# Patient Record
Sex: Female | Born: 1980 | Race: White | Hispanic: No | Marital: Single | Smoking: Never smoker
Health system: Southern US, Community
[De-identification: ages and names within clinical notes are randomized; demographics above are authoritative.]

## PROBLEM LIST (undated history)

## (undated) DIAGNOSIS — T7840XA Allergy, unspecified, initial encounter: Secondary | ICD-10-CM

## (undated) HISTORY — DX: Allergy, unspecified, initial encounter: T78.40XA

## (undated) HISTORY — PX: WISDOM TOOTH EXTRACTION: SHX21

---
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
## 2005-08-06 ENCOUNTER — Ambulatory Visit: Payer: Self-pay | Admitting: Family Medicine

## 2005-10-06 ENCOUNTER — Ambulatory Visit: Payer: Self-pay | Admitting: Family Medicine

## 2006-01-19 ENCOUNTER — Ambulatory Visit: Payer: Self-pay | Admitting: Family Medicine

## 2006-08-03 ENCOUNTER — Ambulatory Visit: Payer: Self-pay | Admitting: Family Medicine

## 2006-08-03 ENCOUNTER — Other Ambulatory Visit: Admission: RE | Admit: 2006-08-03 | Discharge: 2006-08-03 | Payer: Self-pay | Admitting: Internal Medicine

## 2006-08-03 ENCOUNTER — Encounter (INDEPENDENT_AMBULATORY_CARE_PROVIDER_SITE_OTHER): Payer: Self-pay | Admitting: Specialist

## 2006-08-03 DIAGNOSIS — R87612 Low grade squamous intraepithelial lesion on cytologic smear of cervix (LGSIL): Secondary | ICD-10-CM | POA: Insufficient documentation

## 2006-08-03 LAB — CONVERTED CEMR LAB: Pap Smear: ABNORMAL

## 2006-08-04 ENCOUNTER — Encounter: Payer: Self-pay | Admitting: Family Medicine

## 2006-08-04 LAB — CONVERTED CEMR LAB
Blood Glucose, Fasting: 90 mg/dL
TSH: 0.88 microintl units/mL
WBC, blood: 6.4 10*3/uL

## 2006-08-10 ENCOUNTER — Ambulatory Visit: Payer: Self-pay | Admitting: Family Medicine

## 2006-10-17 ENCOUNTER — Ambulatory Visit: Payer: Self-pay | Admitting: Family Medicine

## 2007-01-03 ENCOUNTER — Ambulatory Visit: Payer: Self-pay | Admitting: Family Medicine

## 2008-01-18 ENCOUNTER — Encounter (INDEPENDENT_AMBULATORY_CARE_PROVIDER_SITE_OTHER): Payer: Self-pay | Admitting: Internal Medicine

## 2008-01-18 ENCOUNTER — Ambulatory Visit: Payer: Self-pay | Admitting: Family Medicine

## 2008-01-18 ENCOUNTER — Other Ambulatory Visit: Admission: RE | Admit: 2008-01-18 | Discharge: 2008-01-18 | Payer: Self-pay | Admitting: Family Medicine

## 2008-01-18 DIAGNOSIS — F4321 Adjustment disorder with depressed mood: Secondary | ICD-10-CM

## 2008-01-18 DIAGNOSIS — J309 Allergic rhinitis, unspecified: Secondary | ICD-10-CM | POA: Insufficient documentation

## 2008-01-18 DIAGNOSIS — Z87442 Personal history of urinary calculi: Secondary | ICD-10-CM | POA: Insufficient documentation

## 2008-01-22 ENCOUNTER — Encounter: Payer: Self-pay | Admitting: Family Medicine

## 2008-01-24 LAB — CONVERTED CEMR LAB
Basophils Relative: 0.4 % (ref 0.0–1.0)
CO2: 28 meq/L (ref 19–32)
Creatinine, Ser: 0.7 mg/dL (ref 0.4–1.2)
Eosinophils Relative: 4.6 % (ref 0.0–5.0)
GFR calc Af Amer: 132 mL/min
GFR calc non Af Amer: 109 mL/min
Glucose, Bld: 88 mg/dL (ref 70–99)
HCT: 38.3 % (ref 36.0–46.0)
Hemoglobin: 13.5 g/dL (ref 12.0–15.0)
Lymphocytes Relative: 28.2 % (ref 12.0–46.0)
Monocytes Absolute: 0.3 10*3/uL (ref 0.2–0.7)
Monocytes Relative: 4.8 % (ref 3.0–11.0)
Neutro Abs: 4.1 10*3/uL (ref 1.4–7.7)
Neutrophils Relative %: 62 % (ref 43.0–77.0)
Potassium: 3.8 meq/L (ref 3.5–5.1)
Sodium: 139 meq/L (ref 135–145)
WBC: 6.6 10*3/uL (ref 4.5–10.5)

## 2008-01-25 ENCOUNTER — Encounter (INDEPENDENT_AMBULATORY_CARE_PROVIDER_SITE_OTHER): Payer: Self-pay | Admitting: Internal Medicine

## 2008-01-26 ENCOUNTER — Encounter (INDEPENDENT_AMBULATORY_CARE_PROVIDER_SITE_OTHER): Payer: Self-pay | Admitting: *Deleted

## 2008-02-02 ENCOUNTER — Ambulatory Visit: Payer: Self-pay | Admitting: Family Medicine

## 2008-02-02 DIAGNOSIS — F341 Dysthymic disorder: Secondary | ICD-10-CM | POA: Insufficient documentation

## 2008-02-27 ENCOUNTER — Ambulatory Visit: Payer: Self-pay | Admitting: Family Medicine

## 2008-04-02 ENCOUNTER — Telehealth (INDEPENDENT_AMBULATORY_CARE_PROVIDER_SITE_OTHER): Payer: Self-pay | Admitting: Internal Medicine

## 2008-04-08 ENCOUNTER — Telehealth (INDEPENDENT_AMBULATORY_CARE_PROVIDER_SITE_OTHER): Payer: Self-pay | Admitting: Internal Medicine

## 2008-06-30 ENCOUNTER — Emergency Department (HOSPITAL_COMMUNITY): Admission: EM | Admit: 2008-06-30 | Discharge: 2008-06-30 | Payer: Self-pay | Admitting: Emergency Medicine

## 2008-06-30 ENCOUNTER — Ambulatory Visit: Payer: Self-pay | Admitting: *Deleted

## 2008-06-30 ENCOUNTER — Inpatient Hospital Stay (HOSPITAL_COMMUNITY): Admission: AD | Admit: 2008-06-30 | Discharge: 2008-07-01 | Payer: Self-pay | Admitting: *Deleted

## 2008-09-06 ENCOUNTER — Ambulatory Visit: Payer: Self-pay | Admitting: Family Medicine

## 2008-09-06 ENCOUNTER — Encounter (INDEPENDENT_AMBULATORY_CARE_PROVIDER_SITE_OTHER): Payer: Self-pay | Admitting: Internal Medicine

## 2008-09-12 ENCOUNTER — Encounter (INDEPENDENT_AMBULATORY_CARE_PROVIDER_SITE_OTHER): Payer: Self-pay | Admitting: Internal Medicine

## 2008-09-16 LAB — CONVERTED CEMR LAB: GC Probe Amp, Genital: NEGATIVE

## 2008-11-05 ENCOUNTER — Ambulatory Visit: Payer: Self-pay | Admitting: Family Medicine

## 2008-11-05 DIAGNOSIS — J019 Acute sinusitis, unspecified: Secondary | ICD-10-CM

## 2009-11-26 ENCOUNTER — Other Ambulatory Visit: Admission: RE | Admit: 2009-11-26 | Discharge: 2009-11-26 | Payer: Self-pay | Admitting: Family Medicine

## 2009-11-26 ENCOUNTER — Ambulatory Visit: Payer: Self-pay | Admitting: Family Medicine

## 2009-11-26 DIAGNOSIS — R1310 Dysphagia, unspecified: Secondary | ICD-10-CM | POA: Insufficient documentation

## 2009-11-26 LAB — HM PAP SMEAR

## 2009-11-27 ENCOUNTER — Encounter (INDEPENDENT_AMBULATORY_CARE_PROVIDER_SITE_OTHER): Payer: Self-pay | Admitting: *Deleted

## 2009-12-02 ENCOUNTER — Encounter (INDEPENDENT_AMBULATORY_CARE_PROVIDER_SITE_OTHER): Payer: Self-pay | Admitting: *Deleted

## 2009-12-05 ENCOUNTER — Encounter (INDEPENDENT_AMBULATORY_CARE_PROVIDER_SITE_OTHER): Payer: Self-pay | Admitting: *Deleted

## 2009-12-30 ENCOUNTER — Ambulatory Visit: Payer: Self-pay | Admitting: Gastroenterology

## 2010-01-12 ENCOUNTER — Ambulatory Visit: Payer: Self-pay | Admitting: Family Medicine

## 2010-04-01 ENCOUNTER — Ambulatory Visit: Payer: Self-pay | Admitting: Family Medicine

## 2010-04-03 ENCOUNTER — Ambulatory Visit: Payer: Self-pay | Admitting: Family Medicine

## 2010-04-03 DIAGNOSIS — F988 Other specified behavioral and emotional disorders with onset usually occurring in childhood and adolescence: Secondary | ICD-10-CM | POA: Insufficient documentation

## 2010-04-03 DIAGNOSIS — IMO0002 Reserved for concepts with insufficient information to code with codable children: Secondary | ICD-10-CM | POA: Insufficient documentation

## 2010-05-11 ENCOUNTER — Telehealth: Payer: Self-pay | Admitting: Family Medicine

## 2010-06-15 ENCOUNTER — Telehealth: Payer: Self-pay | Admitting: Family Medicine

## 2010-07-13 ENCOUNTER — Telehealth: Payer: Self-pay | Admitting: Family Medicine

## 2010-08-12 ENCOUNTER — Telehealth: Payer: Self-pay | Admitting: Family Medicine

## 2010-09-14 ENCOUNTER — Telehealth: Payer: Self-pay | Admitting: Family Medicine

## 2010-10-14 ENCOUNTER — Telehealth: Payer: Self-pay | Admitting: Family Medicine

## 2010-11-24 ENCOUNTER — Telehealth (INDEPENDENT_AMBULATORY_CARE_PROVIDER_SITE_OTHER): Payer: Self-pay | Admitting: *Deleted

## 2010-11-24 ENCOUNTER — Telehealth: Payer: Self-pay | Admitting: Family Medicine

## 2011-01-12 ENCOUNTER — Telehealth: Payer: Self-pay | Admitting: Family Medicine

## 2011-01-26 NOTE — Assessment & Plan Note (Signed)
Summary: fever,body aches,coughing,   Vital Signs:  Patient profile:   30 year old female Height:      62.25 inches Weight:      153 pounds BMI:     27.86 Temp:     98.4 degrees F oral Pulse rate:   80 / minute Pulse rhythm:   regular BP sitting:   108 / 68  (left arm) Cuff size:   regular  Vitals Entered By: Delilah Shan CMA Duncan Dull) (January 12, 2010 2:00 PM) CC: Fever, body aches, coughing,    History of Present Illness: 30 yo with 1 1/2 days of high fever, Tmax 102.5, chills, sweats, and body aches. Has had dry cough as well. No sore throat, no ear pain, no n/v/d. No rashes.  No wheezing, no SOB. Taking OTC Alleve and Dayquil with only mild relief of symptoms.  Boyfriend was told he had the flu at urgent care.  Current Medications (verified): 1)  Multivitamins   Tabs (Multiple Vitamin) .... Take 1 Tablet By Mouth Once A Day 2)  Tamiflu 75 Mg Caps (Oseltamivir Phosphate) .... Take One Capsule By Mouth Twice A Day X 5 Days 3)  Cheratussin Ac 100-10 Mg/15ml Syrp (Guaifenesin-Codeine) .... 5 Ml At Bedtime For Cough.  Allergies: 1)  ! Amoxicillin  Review of Systems      See HPI General:  Complains of chills and fever. ENT:  Complains of nasal congestion; denies sinus pressure and sore throat. Resp:  Complains of cough; denies shortness of breath, sputum productive, and wheezing.  Physical Exam  General:  alert, well-developed, well-nourished, and well-hydrated.  Appears like she does not feel well but non toxic. Ears:  External ear exam shows no significant lesions or deformities.  Otoscopic examination reveals clear canals, tympanic membranes are intact bilaterally without bulging, retraction, inflammation or discharge. Hearing is grossly normal bilaterally. Nose:  external erythema.   Mouth:  Oral mucosa and oropharynx without lesions or exudates.  Teeth in good repair. MMM Lungs:  Normal respiratory effort, chest expands symmetrically. Lungs are clear to auscultation,  no crackles or wheezes. Heart:  Normal rate and regular rhythm. S1 and S2 normal without gallop, murmur, click, rub or other extra sounds. Psych:  normally interactive and good eye contact.     Impression & Recommendations:  Problem # 1:  INFLUENZA LIKE ILLNESS (ICD-487.1) Assessment New Given h/o sick contacts and that she is still in window for treatment with Tamiflu, will treat for influenza. Chertussin for cough.  Follow up if no improvement in 5-7 days.  Complete Medication List: 1)  Multivitamins Tabs (Multiple vitamin) .... Take 1 tablet by mouth once a day 2)  Tamiflu 75 Mg Caps (Oseltamivir phosphate) .... Take one capsule by mouth twice a day x 5 days 3)  Cheratussin Ac 100-10 Mg/67ml Syrp (Guaifenesin-codeine) .... 5 ml at bedtime for cough. Prescriptions: CHERATUSSIN AC 100-10 MG/5ML SYRP (GUAIFENESIN-CODEINE) 5 ml at bedtime for cough.  #4 ounces x 0   Entered and Authorized by:   Ruthe Mannan MD   Signed by:   Ruthe Mannan MD on 01/12/2010   Method used:   Print then Give to Patient   RxID:   [id number] TAMIFLU 75 MG CAPS (OSELTAMIVIR PHOSPHATE) Take one capsule by mouth twice a day x 5 days  #10 x 0   Entered and Authorized by:   Ruthe Mannan MD   Signed by:   Ruthe Mannan MD on 01/12/2010   Method used:   Print then Give to Patient  RxID:   [id number]   Current Allergies (reviewed today): ! AMOXICILLIN

## 2011-01-26 NOTE — Progress Notes (Signed)
Summary: regarding meds  Phone Note Call from Patient Call back at [facility] Phone [phone]   Caller: Patient Summary of Call: Pt says she checked with her pharmacy and they have vyvanse and concerta in stock.  They also have adderall 10 mg's. Initial call taken by: Lowella Petties CMA, AAMA,  November 24, 2010 9:23 AM  Follow-up for Phone Call        why can't we just do adderall 10 mg tablets cut in half? I will print out new rx. Ruthe Mannan MD  November 24, 2010 9:45 AM  Patient advised via message left on cell phone voicemail.  Rx left at front desk for pick up. Follow-up by: Linde Gillis CMA Duncan Dull),  November 24, 2010 10:13 AM    New/Updated Medications: ADDERALL 10 MG TABS (AMPHETAMINE-DEXTROAMPHETAMINE) 1/2 tab by mouth every morning. Prescriptions: ADDERALL 10 MG TABS (AMPHETAMINE-DEXTROAMPHETAMINE) 1/2 tab by mouth every morning.  #30 x 0   Entered and Authorized by:   Ruthe Mannan MD   Signed by:   Ruthe Mannan MD on 11/24/2010   Method used:   Print then Give to Patient   RxID:   [id number]

## 2011-01-26 NOTE — Progress Notes (Signed)
Summary: refill request for adderall  Phone Note Refill Request Call back at Home Phone [phone] Message from:  Patient  Refills Requested: Medication #1:  ADDERALL 5 MG  TABS 1 tab by mouth daily.. Please call pt when ready.  Initial call taken by: Lowella Petties CMA,  October 14, 2010 10:02 AM  Follow-up for Phone Call        Message left on cell phone voicemail advising patient Rx ready for pick up, will be left at front desk. Follow-up by: Linde Gillis CMA Duncan Dull),  October 14, 2010 10:33 AM    Prescriptions: ADDERALL 5 MG  TABS (AMPHETAMINE-DEXTROAMPHETAMINE) 1 tab by mouth daily.  #30 x 0   Entered and Authorized by:   Ruthe Mannan MD   Signed by:   Ruthe Mannan MD on 10/14/2010   Method used:   Print then Give to Patient   RxID:   [id number]

## 2011-01-26 NOTE — Progress Notes (Signed)
Summary: adderall   Phone Note Refill Request Call back at Home Phone [phone] Message from:  Patient on July 13, 2010 10:39 AM  Refills Requested: Medication #1:  ADDERALL 5 MG  TABS 1 tab by mouth daily..  Method Requested: Pick up at Office Initial call taken by: Melody Comas,  July 13, 2010 10:39 AM Caller: Patient Call For: Ruthe Mannan MD  Follow-up for Phone Call        in my box for pick up. Ruthe Mannan MD  July 13, 2010 10:41 AM   Additional Follow-up for Phone Call Additional follow up Details #1::        Patient notified by message.  Additional Follow-up by: Melody Comas,  July 13, 2010 11:54 AM    Prescriptions: ADDERALL 5 MG  TABS (AMPHETAMINE-DEXTROAMPHETAMINE) 1 tab by mouth daily.  #30 x 0   Entered and Authorized by:   Ruthe Mannan MD   Signed by:   Ruthe Mannan MD on 07/13/2010   Method used:   Print then Give to Patient   RxID:   [id number]

## 2011-01-26 NOTE — Progress Notes (Signed)
Summary: Adderall  Phone Note Refill Request Message from:  Patient [phone] on June 15, 2010 9:01 AM  Refills Requested: Medication #1:  ADDERALL 5 MG  TABS 1 tab by mouth daily.. Please call patient when prescription is ready for pickup. [phone]   Method Requested: Pick up at Office Initial call taken by: Delilah Shan CMA (AAMA),  June 15, 2010 9:01 AM    Prescriptions: ADDERALL 5 MG  TABS (AMPHETAMINE-DEXTROAMPHETAMINE) 1 tab by mouth daily.  #30 x 0   Entered and Authorized by:   Ruthe Mannan MD   Signed by:   Ruthe Mannan MD on 06/15/2010   Method used:   Print then Give to Patient   RxID:   [id number]   Appended Document: Adderall Message left on cell phone voicemail, Rx ready for pick up will be left at front desk.

## 2011-01-26 NOTE — Progress Notes (Signed)
Summary: refill request for adderall  Phone Note Refill Request Call back at Home Phone [phone] Message from:  Patient  Refills Requested: Medication #1:  ADDERALL 5 MG  TABS 1 tab by mouth daily.. Please call when ready   Initial call taken by: Lowella Petties CMA,  September 14, 2010 3:10 PM  Follow-up for Phone Call        Patient notified as instructed via telephone, Rx ready for pick up will be left at front desk. Follow-up by: Linde Gillis CMA Duncan Dull),  September 14, 2010 3:24 PM    Prescriptions: ADDERALL 5 MG  TABS (AMPHETAMINE-DEXTROAMPHETAMINE) 1 tab by mouth daily.  #30 x 0   Entered and Authorized by:   Ruthe Mannan MD   Signed by:   Ruthe Mannan MD on 09/14/2010   Method used:   Print then Give to Patient   RxID:   [id number]

## 2011-01-26 NOTE — Assessment & Plan Note (Signed)
Summary: back pain/ alc   Vital Signs:  Patient profile:   30 year old female Height:      62.25 inches Weight:      151.13 pounds BMI:     27.52 Temp:     97.5 degrees F oral Pulse rate:   84 / minute Pulse rhythm:   regular BP sitting:   104 / 64  (left arm) Cuff size:   regular  Vitals Entered By: Delilah Shan CMA Duncan Dull) (April 03, 2010 8:05 AM) CC: Back pain   2.  ? ADD issue   History of Present Illness: 30 yo here for back pain and ADD.  1.  Back pain- has been doing new intensive fitness routine, weight lifting and cardio 5 days per week.  Noticed when she gets home that her left thoracic area is sore, especially when taking deep breaths, laughing, coughing.  Saw a chiropracter who told her she pulled a muscle between her rib cage. Strectching better doing cool down, which has helped. No shorntess of breath radiculopathy or muscluar weakness.  2.  ADD- was told that she had ADHD or ADD in college but always did well.  Took her a little longe to finish projects but she did well.  Now she is working two jobs and going to school at night.  She stays up until 3 am every night trying to finish her work because she cannot concentrate.  Has big exams coming up then she will be done, interested in taking something to help with concentration until she finishes. Does not have problems sitting still.  Can sit through a movie without getting up but does often loose focus.  Current Medications (verified): 1)  Multivitamins   Tabs (Multiple Vitamin) .... Take 1 Tablet By Mouth Once A Day 2)  Cyclobenzaprine Hcl 10 Mg  Tabs (Cyclobenzaprine Hcl) .Marland Kitchen.. 1 By Mouth 2 Times Daily As Needed For Back Pain 3)  Adderall 5 Mg  Tabs (Amphetamine-Dextroamphetamine) .Marland Kitchen.. 1 Tab By Mouth Daily.  Allergies: 1)  ! Amoxicillin  Review of Systems      See HPI General:  Denies malaise. CV:  Denies chest pain or discomfort. Resp:  Denies shortness of breath. Psych:  Denies anxiety and  depression.  Physical Exam  General:  alert, well-developed, well-nourished, and well-hydrated.   Normotensive. Eyes:  No corneal or conjunctival inflammation noted. EOMI. Perrla. Funduscopic exam benign, without hemorrhages, exudates or papilledema. Vision grossly normal. Mouth:  MMM Lungs:  Normal respiratory effort, chest expands symmetrically. Lungs are clear to auscultation, no crackles or wheezes. Heart:  Normal rate and regular rhythm. S1 and S2 normal without gallop, murmur, click, rub or other extra sounds.   Detailed Back/Spine Exam  Thoracic Exam:  Inspection-deformity:    Normal Palpation-spinal tenderness:  Normal Sensory Exam/Pinprick:    Right:       T1:       normal       T2:       normal       T3:       normal       T4:       normal       T5:       normal       T6:       normal       T7:       normal       T8:       normal  Left:       T1:       normal       T2:       normal       T3:       normal       T4:       normal       T5:       normal       T6:       normal       T7:       normal       T8:       normal   Impression & Recommendations:  Problem # 1:  SPRAIN AND STRAIN OF UNSPECIFIED SITE OF BACK (ICD-847.9) Assessment New No red flag symptoms. Will go ahead and give Flexeril as needed. Discussed stretches as well.  Problem # 2:  ADD (ICD-314.00) Assessment: New Time spent with patient 25 minutes, more than 50% of this time was spent counseling patient on ADD/ADHD. Since she did have symptoms earlier in life, likely that she does have some form of ADD.  No apparent symptoms of hyperactivity. Will give Adderall, low dose for short period of time until she finishes her exam.  Advised taking in morning and NOT on weekend.s  Complete Medication List: 1)  Multivitamins Tabs (Multiple vitamin) .... Take 1 tablet by mouth once a day 2)  Cyclobenzaprine Hcl 10 Mg Tabs (Cyclobenzaprine hcl) .Marland Kitchen.. 1 by mouth 2 times daily as needed for back pain 3)   Adderall 5 Mg Tabs (Amphetamine-dextroamphetamine) .Marland Kitchen.. 1 tab by mouth daily. Prescriptions: ADDERALL 5 MG  TABS (AMPHETAMINE-DEXTROAMPHETAMINE) 1 tab by mouth daily.  #30 x 0   Entered and Authorized by:   Ruthe Mannan MD   Signed by:   Ruthe Mannan MD on 04/03/2010   Method used:   Print then Give to Patient   RxID:   [id number] CYCLOBENZAPRINE HCL 10 MG  TABS (CYCLOBENZAPRINE HCL) 1 by mouth 2 times daily as needed for back pain  #20 x 0   Entered and Authorized by:   Ruthe Mannan MD   Signed by:   Ruthe Mannan MD on 04/03/2010   Method used:   [facility] to        [id number]       [address]       [place], [place]  [address]       Ph: [phone]       Fax: [phone]   RxID:   [id number]   Current Allergies (reviewed today): ! AMOXICILLIN

## 2011-01-26 NOTE — Progress Notes (Signed)
Summary: regarding adderall  Phone Note Call from Patient Call back at [facility] Phone [phone]   Caller: Patient Summary of Call: Pt needs a refill on adderall but she says this is very hard to find at pharmacies and she is asking if she can change to something else until adderall becomes more available.  [facility]. Initial call taken by: Lowella Petties CMA, AAMA,  November 24, 2010 8:20 AM  Follow-up for Phone Call        please call her pharmacy to see what they have in stock- i.e. ritalin, etc. Ruthe Mannan MD  November 24, 2010 8:21 AM Advised pt, she will call back with information.   Lowella Petties CMA, AAMA  November 24, 2010 9:11 AM

## 2011-01-26 NOTE — Progress Notes (Signed)
Summary: adderall  Phone Note Refill Request Call back at Home Phone [phone] Message from:  Patient on August 12, 2010 9:25 AM  Refills Requested: Medication #1:  ADDERALL 5 MG  TABS 1 tab by mouth daily..  Method Requested: Pick up at Office Initial call taken by: Melody Comas,  August 12, 2010 9:25 AM    Prescriptions: ADDERALL 5 MG  TABS (AMPHETAMINE-DEXTROAMPHETAMINE) 1 tab by mouth daily.  #30 x 0   Entered and Authorized by:   Ruthe Mannan MD   Signed by:   Ruthe Mannan MD on 08/12/2010   Method used:   Print then Give to Patient   RxID:   [id number]   Appended Document: adderall Patient advised via message left on cell phone voicemail.  Rx ready for pick up, will be left at front desk.

## 2011-01-26 NOTE — Progress Notes (Signed)
Summary: REFILL ON ADDERALL  Phone Note Call from Patient   Caller: Patient Call For: [phone] Reason for Call: Acute Illness Summary of Call: Pt called, need refill for Adderall refill.Daine Gip  May 11, 2010 3:52 PM  Initial call taken by: Daine Gip,  May 11, 2010 3:52 PM  Follow-up for Phone Call        In my box. Ruthe Mannan MD  May 11, 2010 3:56 PM     Prescriptions: ADDERALL 5 MG  TABS (AMPHETAMINE-DEXTROAMPHETAMINE) 1 tab by mouth daily.  #30 x 0   Entered and Authorized by:   Ruthe Mannan MD   Signed by:   Ruthe Mannan MD on 05/11/2010   Method used:   Print then Give to Patient   RxID:   [id number]   Appended Document: REFILL ON ADDERALL Left message on cell phone voicemail that Rx is ready for pick up, will be left at front desk.

## 2011-01-28 NOTE — Progress Notes (Signed)
Summary: refill request for adderall  Phone Note Refill Request Message from:  Patient  Refills Requested: Medication #1:  ADDERALL 10 MG TABS 1/2 tab by mouth every morning.. Please call pt when ready.  Initial call taken by: Lowella Petties CMA, AAMA,  January 12, 2011 12:19 PM  Follow-up for Phone Call        Message left on cell phone voicemail advising patient that Rx is ready for pick up will be left at front desk. Follow-up by: Linde Gillis CMA Duncan Dull),  January 13, 2011 8:09 AM    Prescriptions: ADDERALL 10 MG TABS (AMPHETAMINE-DEXTROAMPHETAMINE) 1/2 tab by mouth every morning.  #30 x 0   Entered and Authorized by:   Ruthe Mannan MD   Signed by:   Ruthe Mannan MD on 01/13/2011   Method used:   Print then Give to Patient   RxID:   [id number]

## 2011-03-17 ENCOUNTER — Telehealth: Payer: Self-pay | Admitting: *Deleted

## 2011-03-17 NOTE — Telephone Encounter (Signed)
Pt requests refill on adderal, please call when ready.

## 2011-03-18 ENCOUNTER — Other Ambulatory Visit: Payer: Self-pay | Admitting: *Deleted

## 2011-03-18 ENCOUNTER — Other Ambulatory Visit: Payer: Self-pay | Admitting: Obstetrics and Gynecology

## 2011-03-18 MED ORDER — AMPHETAMINE-DEXTROAMPHETAMINE 10 MG PO TABS: ORAL_TABLET | ORAL | Status: DC

## 2011-03-18 NOTE — Telephone Encounter (Signed)
Patient advised Rx ready for pick up will be left at front desk. 

## 2011-03-18 NOTE — Telephone Encounter (Signed)
Please enter proper rx request with dosage, etc. Thanks.

## 2011-05-14 NOTE — Discharge Summary (Signed)
Sabrina Robertson, Sabrina Robertson              ACCOUNT NO.:  [id number]   MEDICAL RECORD NO.:  [id number]          PATIENT TYPE:  IPS   LOCATION:  0604                          FACILITY:  [facility]   PHYSICIAN:  Geoffery Lyons, M.D.      DATE OF BIRTH:  02/05/1981   DATE OF ADMISSION:  06/30/2008  DATE OF DISCHARGE:  07/01/2008                               DISCHARGE SUMMARY   CHIEF COMPLAINT AND PRESENT ILLNESS:  This was the first admission to  [facility] for this 30 year old single female who was  admitted after she overdosed, taking some of the boyfriend's Klonopin  after an argument with him.  Says that they were in [place] [place].  They  partied a lot.  Came back to the area.  She was at a cookout with  friends.  She was drinking with the boyfriend.  They got into an  argument, says the boyfriend was jealous of another friend.  The  argument escalated.  They talked about breaking up.  She left, went to  her home.  She was upset, started thinking about the grandmother who  died a few months prior to this admission.  She is having a hard time  with the grandmother's death.  Took the Klonopin, claims for attention.  Endorsed that the Klonopin hit her faster.  She could not take many.  911 was called.  She was taken to the emergency room and she woke up in  the emergency room.   PAST PSYCHIATRIC HISTORY:  Saw primary care Katisha Shimizu who gave her  Prestiq.  Denies any previous treatment.   ALCOHOL AND DRUG HISTORY:  Denies denies regular use of any substance.  Endorsed she was partying that day, took 4-5 beers, 1 margarita.  Endorsed she was given some marijuana by her boyfriend and she was given  some Adderall so she could continue to party, but endorsed no ongoing  use.   MEDICAL HISTORY:  Noncontributory.   MEDICATIONS:  Prestiq 50 mg per day.   PHYSICAL EXAMINATION:  Failed to show any acute findings.   MENTAL STATUS EXAMINATION:  Reveals an alert cooperative female,  spontaneous,  engaged, regretful for what she did.  Insightful.  Wanted  to be discharged.  Denies suicidal ideas, homicidal ideas and that she  was willing to see a counselor for herself, as well as a couple.  Endorsed that she realized that there were some grief issues having to  do with the death of her grandmother that she needed to deal, but felt  that she did not have to stay in the hospital to do that.  Family was  contacted.  There were no concerns about her safety.  We went and  discharged her to outpatient followup.   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  AXIS II:  No diagnosis.  AXIS III:  No diagnosis.  AXIS IV:  Moderate.  AXIS V:  Upon discharge, 60.   DISCHARGE MEDICATIONS:  Prestiq 50 mg per day.   FOLLOWUP:  Doristine Church,  counselor, and Donnie Aho for medication,  [employer].      Geoffery Lyons, M.D.  Electronically Signed     IL/MEDQ  D:  07/31/2008  T:  07/31/2008  Job:  [id number]

## 2011-05-17 ENCOUNTER — Other Ambulatory Visit: Payer: Self-pay | Admitting: *Deleted

## 2011-05-17 MED ORDER — AMPHETAMINE-DEXTROAMPHETAMINE 10 MG PO TABS: ORAL_TABLET | ORAL | Status: DC

## 2011-05-17 NOTE — Telephone Encounter (Signed)
Sabrina Robertson, has this been taken care of? 

## 2011-05-18 NOTE — Telephone Encounter (Signed)
Patient advised Rx ready for pick up will be left at front desk. 

## 2011-06-13 ENCOUNTER — Ambulatory Visit: Payer: Self-pay | Admitting: Family Medicine

## 2011-06-15 ENCOUNTER — Telehealth: Payer: Self-pay | Admitting: *Deleted

## 2011-06-15 NOTE — Telephone Encounter (Signed)
Patient broke her foot on Saturday and went to urgent care on Sunday. They referred her to [facility] and she says that they prescribed hydrocodone for her. She says that it is making her feel sick on her stomach.  She is asking if she can get something different called in. I told her to call [facility] and she says that she already has and has left several messages and no one has call her back and that she really needs something to help with the pain. [facility].

## 2011-06-16 MED ORDER — TRAMADOL HCL 50 MG PO TABS: 50.0000 mg | ORAL_TABLET | Freq: Four times a day (QID) | ORAL | Status: DC | PRN

## 2011-06-16 NOTE — Telephone Encounter (Signed)
Left message on cell phone voicemail that Rx for Tramadol was sent [facility].

## 2011-06-16 NOTE — Telephone Encounter (Signed)
Tramadol sent in 

## 2011-07-19 ENCOUNTER — Other Ambulatory Visit: Payer: Self-pay | Admitting: *Deleted

## 2011-07-19 MED ORDER — AMPHETAMINE-DEXTROAMPHETAMINE 10 MG PO TABS: ORAL_TABLET | ORAL | Status: DC

## 2011-07-19 NOTE — Telephone Encounter (Signed)
Patient advised as instructed via telephone that Rx is ready for pick up will be left at front desk.

## 2011-07-21 ENCOUNTER — Other Ambulatory Visit: Payer: Self-pay | Admitting: *Deleted

## 2011-07-21 NOTE — Telephone Encounter (Signed)
Ok to print out rx for 15. I will sign tomorrow morning.

## 2011-07-21 NOTE — Telephone Encounter (Signed)
Pt states that her insurance has changed and she needs her adderall rewritten for #15, since she only takes one half a day.  If possible she would like a 2nd script for 15 for next month.  Please call when ready, [facility] but says she can get this back if necessary.

## 2011-07-22 MED ORDER — AMPHETAMINE-DEXTROAMPHETAMINE 10 MG PO TABS: ORAL_TABLET | ORAL | Status: DC

## 2011-07-22 NOTE — Telephone Encounter (Signed)
Rx's left at front desk for patient pick up.  Message left on cell phone voicemail advising patient as instructed.

## 2011-09-23 LAB — DIFFERENTIAL
Eosinophils Absolute: 0.3
Eosinophils Relative: 4
Lymphocytes Relative: 33
Lymphs Abs: 2
Monocytes Absolute: 0.4
Monocytes Relative: 6

## 2011-09-23 LAB — URINALYSIS, ROUTINE W REFLEX MICROSCOPIC
Nitrite: NEGATIVE
Specific Gravity, Urine: 1.015
Urobilinogen, UA: 0.2
pH: 5.5

## 2011-09-23 LAB — COMPREHENSIVE METABOLIC PANEL
ALT: 17
AST: 21
Albumin: 3.3 — ABNORMAL LOW
CO2: 21
Calcium: 8.4
Creatinine, Ser: 0.62
GFR calc Af Amer: >60
GFR calc non Af Amer: >60
Sodium: 140

## 2011-09-23 LAB — RAPID URINE DRUG SCREEN, HOSP PERFORMED
Amphetamines: POSITIVE — AB
Benzodiazepines: NOT DETECTED
Opiates: NOT DETECTED
Tetrahydrocannabinol: POSITIVE — AB

## 2011-09-23 LAB — URINE MICROSCOPIC-ADD ON

## 2011-09-23 LAB — CBC
MCHC: 35
MCV: 88.4
Platelets: 193
RBC: 4.13
WBC: 6.1

## 2011-09-23 LAB — LIPASE, BLOOD: Lipase: 19

## 2011-09-28 ENCOUNTER — Other Ambulatory Visit: Payer: Self-pay | Admitting: *Deleted

## 2011-09-28 NOTE — Telephone Encounter (Signed)
Patient needs a refill on her Adderall. Please call when ready. Patient is aware that Dr. Dayton Martes is out today.

## 2011-09-29 MED ORDER — AMPHETAMINE-DEXTROAMPHETAMINE 10 MG PO TABS: ORAL_TABLET | ORAL | Status: DC

## 2011-09-29 NOTE — Telephone Encounter (Signed)
Patient advised via telephone, rx ready for pick up will be left at front desk.

## 2011-10-08 ENCOUNTER — Encounter: Payer: Self-pay | Admitting: Family Medicine

## 2011-10-12 ENCOUNTER — Ambulatory Visit (INDEPENDENT_AMBULATORY_CARE_PROVIDER_SITE_OTHER): Payer: 59 | Admitting: Family Medicine

## 2011-10-12 ENCOUNTER — Encounter: Payer: Self-pay | Admitting: Family Medicine

## 2011-10-12 VITALS — BP 120/80 | HR 68 | Temp 98.7°F | Ht 62.5 in | Wt 147.0 lb

## 2011-10-12 DIAGNOSIS — Z0000 Encounter for general adult medical examination without abnormal findings: Secondary | ICD-10-CM | POA: Insufficient documentation

## 2011-10-12 DIAGNOSIS — F988 Other specified behavioral and emotional disorders with onset usually occurring in childhood and adolescence: Secondary | ICD-10-CM

## 2011-10-12 DIAGNOSIS — F341 Dysthymic disorder: Secondary | ICD-10-CM

## 2011-10-12 DIAGNOSIS — Z136 Encounter for screening for cardiovascular disorders: Secondary | ICD-10-CM

## 2011-10-12 LAB — CBC WITH DIFFERENTIAL/PLATELET
Basophils Absolute: 0 10*3/uL (ref 0.0–0.1)
Eosinophils Relative: 7.4 % — ABNORMAL HIGH (ref 0.0–5.0)
HCT: 41.1 % (ref 36.0–46.0)
Hemoglobin: 14 g/dL (ref 12.0–15.0)
Lymphocytes Relative: 43.9 % (ref 12.0–46.0)
Lymphs Abs: 2.3 10*3/uL (ref 0.7–4.0)
Monocytes Relative: 7.4 % (ref 3.0–12.0)
Neutro Abs: 2.1 10*3/uL (ref 1.4–7.7)
RBC: 4.48 Mil/uL (ref 3.87–5.11)
RDW: 12.8 % (ref 11.5–14.6)
WBC: 5.2 10*3/uL (ref 4.5–10.5)

## 2011-10-12 LAB — BASIC METABOLIC PANEL
BUN: 13 mg/dL (ref 6–23)
Calcium: 9.4 mg/dL (ref 8.4–10.5)
GFR: 79.26 mL/min (ref >60.00)
Glucose, Bld: 80 mg/dL (ref 70–99)
Potassium: 4 mEq/L (ref 3.5–5.1)
Sodium: 138 mEq/L (ref 135–145)

## 2011-10-12 LAB — TSH: TSH: 1.73 u[IU]/mL (ref 0.35–5.50)

## 2011-10-12 LAB — LIPID PANEL
HDL: 60.9 mg/dL (ref >39.00)
LDL Cholesterol: 93 mg/dL (ref 0–99)
VLDL: 20.8 mg/dL (ref 0.0–40.0)

## 2011-10-12 MED ORDER — CITALOPRAM HYDROBROMIDE 20 MG PO TABS: 20.0000 mg | ORAL_TABLET | Freq: Every day | ORAL | Status: DC

## 2011-10-12 MED ORDER — ALPRAZOLAM 0.25 MG PO TABS: 0.2500 mg | ORAL_TABLET | Freq: Three times a day (TID) | ORAL | Status: DC | PRN

## 2011-10-12 NOTE — Progress Notes (Signed)
Subjective:    Patient ID: Sabrina Robertson, female    DOB: 27-Jun-1981, 30 y.o.   MRN: [id number]  HPI  30 yo here for CPX. Has OBGYN. Overall doing well but has worsening anxiety.  Anxiety- Increased stessors in her.  New job, parents getting divorced, tension with her boyfriend. Has had some panic attacks- heart races, gets tearful, needs to sit down and take deep breaths. Not sleeping as well. Still exercising and doing things she likes to do. No SI or HI.  Stopped taking her Adderall because it was making her anxiety worse.  Patient Active Problem List  Diagnoses  . ANXIETY DEPRESSION  . DEPRESSION, SITUATIONAL  . ADD  . SINUSITIS- ACUTE-NOS  . ALLERGIC RHINITIS  . DYSPHAGIA UNSPECIFIED  . 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 SPRAIN AND STRAIN OF UNSPECIFIED SITE OF BACK  . RENAL CALCULUS, HX OF  . Routine general medical examination at a health care facility   Past Medical History  Diagnosis Date  . Allergy    Past Surgical History  Procedure Date  . Wisdom tooth extraction   . Orif acetabular fracture 1999    Left 4th finger   History  Substance Use Topics  . Smoking status: Not on file  . Smokeless tobacco: Not on file  . Alcohol Use:    Family History  Problem Relation Age of Onset  . Hypertension Father   . Hypertension Maternal Aunt   . Diabetes Maternal Grandmother    Allergies  Allergen Reactions  . Amoxicillin     REACTION: itchy   Current Outpatient Prescriptions on File Prior to Visit  Medication Sig Dispense Refill  . amphetamine-dextroamphetamine (ADDERALL, 10MG ,) 10 MG tablet Take 1/2 tablet by mouth every morning.  15 tablet  0  . Multiple Vitamin (MULTIVITAMIN) tablet Take 1 tablet by mouth daily.        . traMADol (ULTRAM) 50 MG tablet Take 1 tablet (50 mg total) by mouth every 6 (six) hours as needed for pain.  20 tablet  0   The PMH, PSH, Social History, Family History, Medications, and allergies have been reviewed  in [facility], and have been updated if relevant.   Review of Systems See HPI Patient reports no  vision/ hearing changes,anorexia, weight change, fever ,adenopathy, persistant / recurrent hoarseness, swallowing issues, chest pain, edema,persistant / recurrent cough, hemoptysis, dyspnea(rest, exertional, paroxysmal nocturnal), gastrointestinal  bleeding (melena, rectal bleeding), abdominal pain, excessive heart burn, GU symptoms(dysuria, hematuria, pyuria, voiding/incontinence  Issues) syncope, focal weakness, severe memory loss, concerning skin lesions, depression, anxiety, abnormal bruising/bleeding, major joint swelling, breast masses or abnormal vaginal bleeding.       Objective:   Physical Exam BP 120/80  Pulse 68  Temp(Src) 98.7 F (37.1 C) (Oral)  Ht 5' 2.5" (1.588 m)  Wt 147 lb (66.679 kg)  BMI 26.46 kg/m2  LMP 09/20/2011  General:  Well-developed,well-nourished,in no acute distress; alert,appropriate and cooperative throughout examination Head:  normocephalic and atraumatic.   Eyes:  vision grossly intact, pupils equal, pupils round, and pupils reactive to light.   Ears:  R ear normal and L ear normal.   Nose:  no external deformity.   Mouth:  good dentition.   Neck:  No deformities, masses, or tenderness noted. Lungs:  Normal respiratory effort, chest expands symmetrically. Lungs are clear to auscultation, no crackles or wheezes. Heart:  Normal rate and regular rhythm. S1 and S2 normal without gallop, murmur, click, rub or other extra sounds. Abdomen:  Bowel sounds positive,abdomen soft and non-tender without masses, organomegaly or hernias noted. Msk:  No deformity or scoliosis noted of thoracic or lumbar spine.   Extremities:  No clubbing, cyanosis, edema, or deformity noted with normal full range of motion of all joints.   Neurologic:  alert & oriented X3 and gait normal.   Skin:  Intact without suspicious lesions or rashes Cervical Nodes:  No lymphadenopathy noted Psych:   Cognition and judgment appear intact. Alert and cooperative with normal attention span and concentration. No apparent delusions, illusions, hallucinations     Assessment & Plan:   1. Routine general medical examination at a health care facility  Reviewed preventive care protocols, scheduled due services, and updated immunizations Discussed nutrition, exercise, diet, and healthy lifestyle.  Basic Metabolic Panel (BMET)  2. ANXIETY DEPRESSION  New. Will check labs to rule out other contributing factors. Start Celexa and Xanax as needed. Follow up in 1 month. Deferred psychotherapy at this time.  TSH, CBC w/Diff

## 2011-10-12 NOTE — Patient Instructions (Signed)
Good to see you. Please let me know how you're doing in a few weeks. Hang in there.

## 2011-10-13 ENCOUNTER — Encounter: Payer: Self-pay | Admitting: *Deleted

## 2011-10-25 ENCOUNTER — Telehealth: Payer: Self-pay | Admitting: Family Medicine

## 2011-10-25 MED ORDER — ALPRAZOLAM 0.25 MG PO TABS: 0.2500 mg | ORAL_TABLET | Freq: Three times a day (TID) | ORAL | Status: DC | PRN

## 2011-10-25 NOTE — Telephone Encounter (Signed)
Medicine called to [facility].

## 2011-10-25 NOTE — Telephone Encounter (Signed)
Requesting Xanax refill.  York Spaniel it is working well.  Call back is [phone]

## 2011-10-25 NOTE — Telephone Encounter (Signed)
Ok to refill one month supply. 

## 2011-11-25 ENCOUNTER — Telehealth: Payer: Self-pay | Admitting: *Deleted

## 2011-11-25 MED ORDER — ALPRAZOLAM 0.5 MG PO TABS: 0.2500 mg | ORAL_TABLET | Freq: Three times a day (TID) | ORAL | Status: DC | PRN

## 2011-11-25 NOTE — Telephone Encounter (Signed)
Pt was put on xanax last month, wants to know if it can be increased, she says she is having a stressful holiday season and she works in Engineering ge[employer].

## 2011-11-25 NOTE — Telephone Encounter (Signed)
Rx called [facility].  Left message on cell phone voicemail advising patient as instructed.

## 2011-11-25 NOTE — Telephone Encounter (Signed)
Please phone in increased dose as entered below.

## 2011-12-06 ENCOUNTER — Telehealth: Payer: Self-pay | Admitting: Internal Medicine

## 2011-12-08 MED ORDER — NORETHIN-ETH ESTRAD-FE BIPHAS 1 MG-10 MCG / 10 MCG PO TABS: 1.0000 | ORAL_TABLET | Freq: Every day | ORAL | Status: DC

## 2011-12-08 NOTE — Telephone Encounter (Signed)
Sent Rx to pharmacy  

## 2011-12-27 ENCOUNTER — Other Ambulatory Visit: Payer: Self-pay | Admitting: Internal Medicine

## 2011-12-27 NOTE — Telephone Encounter (Signed)
Refill request

## 2011-12-28 MED ORDER — ALPRAZOLAM 0.5 MG PO TABS: 0.5000 mg | ORAL_TABLET | Freq: Three times a day (TID) | ORAL | Status: DC | PRN

## 2011-12-28 NOTE — Telephone Encounter (Signed)
Please call in.  If continued need for this med, then have pt f/u with Dr. Dayton Martes.  Thanks.

## 2011-12-29 NOTE — Telephone Encounter (Signed)
Medication phoned to pharmacy. LMOVM of home/cell number to see Dr. Dayton Martes for further refills.

## 2012-01-04 ENCOUNTER — Other Ambulatory Visit: Payer: Self-pay | Admitting: Internal Medicine

## 2012-01-04 MED ORDER — CITALOPRAM HYDROBROMIDE 20 MG PO TABS: 20.0000 mg | ORAL_TABLET | Freq: Every day | ORAL | Status: DC

## 2012-01-04 NOTE — Telephone Encounter (Signed)
Refill request. Patient last seen on 10.16.12

## 2012-05-02 ENCOUNTER — Other Ambulatory Visit: Payer: Self-pay | Admitting: *Deleted

## 2012-05-02 MED ORDER — CITALOPRAM HYDROBROMIDE 20 MG PO TABS: 20.0000 mg | ORAL_TABLET | Freq: Every day | ORAL | Status: DC

## 2012-11-13 ENCOUNTER — Other Ambulatory Visit (INDEPENDENT_AMBULATORY_CARE_PROVIDER_SITE_OTHER): Payer: PRIVATE HEALTH INSURANCE

## 2012-11-13 ENCOUNTER — Other Ambulatory Visit: Payer: Self-pay | Admitting: Family Medicine

## 2012-11-13 ENCOUNTER — Encounter: Payer: Self-pay | Admitting: Family Medicine

## 2012-11-13 ENCOUNTER — Ambulatory Visit (INDEPENDENT_AMBULATORY_CARE_PROVIDER_SITE_OTHER): Payer: PRIVATE HEALTH INSURANCE | Admitting: Family Medicine

## 2012-11-13 VITALS — BP 104/66 | HR 76 | Temp 98.2°F | Wt 150.0 lb

## 2012-11-13 DIAGNOSIS — Z0000 Encounter for general adult medical examination without abnormal findings: Secondary | ICD-10-CM

## 2012-11-13 DIAGNOSIS — Z136 Encounter for screening for cardiovascular disorders: Secondary | ICD-10-CM

## 2012-11-13 DIAGNOSIS — J329 Chronic sinusitis, unspecified: Secondary | ICD-10-CM

## 2012-11-13 LAB — LIPID PANEL
HDL: 61.4 mg/dL (ref >39.00)
Total CHOL/HDL Ratio: 3
VLDL: 25 mg/dL (ref 0.0–40.0)

## 2012-11-13 LAB — COMPREHENSIVE METABOLIC PANEL
ALT: 16 U/L (ref 0–35)
AST: 16 U/L (ref 0–37)
Chloride: 109 mEq/L (ref 96–112)
Creatinine, Ser: 0.8 mg/dL (ref 0.4–1.2)
Sodium: 141 mEq/L (ref 135–145)
Total Bilirubin: 0.6 mg/dL (ref 0.3–1.2)
Total Protein: 7.8 g/dL (ref 6.0–8.3)

## 2012-11-13 MED ORDER — AZITHROMYCIN 250 MG PO TABS: ORAL_TABLET | ORAL | Status: DC

## 2012-11-13 MED ORDER — FLUTICASONE PROPIONATE 50 MCG/ACT NA SUSP: 2.0000 | Freq: Every day | NASAL | Status: DC

## 2012-11-13 NOTE — Progress Notes (Signed)
SUBJECTIVE:  Sabrina Robertson is a 31 y.o. female who complains of coryza, congestion, sneezing, sore throat, myalgias and bilateral sinus pain for 10 days. She denies a history of anorexia, chest pain, chills, dizziness and fevers and denies a history of asthma. Patient denies smoke cigarettes.   Patient Active Problem List  Diagnosis  . ANXIETY DEPRESSION  . DEPRESSION, SITUATIONAL  . ADD  . SINUSITIS- ACUTE-NOS  . ALLERGIC RHINITIS  . DYSPHAGIA UNSPECIFIED  . 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 SPRAIN AND STRAIN OF UNSPECIFIED SITE OF BACK  . RENAL CALCULUS, HX OF  . Routine general medical examination at a health care facility   Past Medical History  Diagnosis Date  . Allergy    Past Surgical History  Procedure Date  . Wisdom tooth extraction   . Orif acetabular fracture 1999    Left 4th finger   History  Substance Use Topics  . Smoking status: Never Smoker   . Smokeless tobacco: Not on file  . Alcohol Use: Not on file   Family History  Problem Relation Age of Onset  . Hypertension Father   . Hypertension Maternal Aunt   . Diabetes Maternal Grandmother    Allergies  Allergen Reactions  . Amoxicillin     REACTION: itchy   Current Outpatient Prescriptions on File Prior to Visit  Medication Sig Dispense Refill  . ALPRAZolam (XANAX) 0.5 MG tablet Take 1 tablet (0.5 mg total) by mouth 3 (three) times daily as needed for anxiety.  90 tablet  0  . amphetamine-dextroamphetamine (ADDERALL, 10MG ,) 10 MG tablet Take 1/2 tablet by mouth every morning.  15 tablet  0  . citalopram (CELEXA) 20 MG tablet Take 1 tablet (20 mg total) by mouth daily.  90 tablet  1  . Multiple Vitamin (MULTIVITAMIN) tablet Take 1 tablet by mouth daily.        . Norethindrone-Ethinyl Estradiol-Fe Biphas (LO LOESTRIN FE) 1 MG-10 MCG / 10 MCG tablet Take 1 tablet by mouth daily.  1 Package  12   The PMH, PSH, Social History, Family History, Medications, and allergies have been reviewed  in [facility], and have been updated if relevant.  OBJECTIVE: BP 104/66  Pulse 76  Temp 98.2 F (36.8 C)  Wt 150 lb (68.04 kg)  She appears well, vital signs are as noted. Ears normal.  Throat and pharynx normal.  Neck supple. No adenopathy in the neck. Nose is congested. Sinuses  tender. The chest is clear, without wheezes or rales.  ASSESSMENT:  sinusitis  PLAN: Given duration and progression of symptoms, will treat for bacterial sinusitis with Zpack, PCN allergic.  Symptomatic therapy suggested: push fluids, rest and return office visit prn if symptoms persist or worsen.Call or return to clinic prn if these symptoms worsen or fail to improve as anticipated.

## 2012-11-13 NOTE — Patient Instructions (Addendum)
Take antibiotic as directed. Start Flonase.   Drink lots of fluids.  Treat sympotmatically with Mucinex, nasal saline irrigation, and Tylenol/Ibuprofen. Also try claritin D or zyrtec D over the counter- two times a day as needed ( have to sign for them at pharmacy). You can use warm compresses.  Cough suppressant at night. Call if not improving as expected in 5-7 days.

## 2012-11-16 ENCOUNTER — Encounter: Payer: Self-pay | Admitting: Family Medicine

## 2012-11-16 ENCOUNTER — Ambulatory Visit (INDEPENDENT_AMBULATORY_CARE_PROVIDER_SITE_OTHER): Payer: PRIVATE HEALTH INSURANCE | Admitting: Family Medicine

## 2012-11-16 VITALS — BP 102/62 | HR 80 | Temp 97.4°F | Ht 63.5 in | Wt 153.0 lb

## 2012-11-16 DIAGNOSIS — Z0000 Encounter for general adult medical examination without abnormal findings: Secondary | ICD-10-CM

## 2012-11-16 DIAGNOSIS — F341 Dysthymic disorder: Secondary | ICD-10-CM

## 2012-11-16 MED ORDER — ALPRAZOLAM 0.5 MG PO TABS: 0.5000 mg | ORAL_TABLET | Freq: Three times a day (TID) | ORAL | Status: DC | PRN

## 2012-11-16 MED ORDER — CITALOPRAM HYDROBROMIDE 20 MG PO TABS: 20.0000 mg | ORAL_TABLET | Freq: Every day | ORAL | Status: DC

## 2012-11-16 MED ORDER — NORETHIN-ETH ESTRAD-FE BIPHAS 1 MG-10 MCG / 10 MCG PO TABS: 1.0000 | ORAL_TABLET | Freq: Every day | ORAL | Status: DC

## 2012-11-16 NOTE — Patient Instructions (Addendum)
Good to see you. Have a great holiday.  Call me in about a month with an update of you anxiety.  Congratulations on your new job!

## 2012-11-16 NOTE — Progress Notes (Signed)
Subjective:    Patient ID: Sabrina Robertson, female    DOB: 1981/04/25, 31 y.o.   MRN: [id number]  HPI  31 yo here for CPX.  Recent URI.  Feeling better.  Has OBGYN. Last pap smear was normal in 02/2011.  Scheduled to see GYN again in 12/2012.  Anxiety- Increased stessors in her. Has another new job. Stopped taking the Celexa and Xanax because she lost her insurance but now has insurance again.   Celexa was working well for her previously.  No side effects.  Has had some panic attacks- heart races, gets tearful, needs to sit down and take deep breaths. Not sleeping as well. Still exercising and doing things she likes to do. No SI or HI.  Stopped taking her Adderall because it was making her anxiety worse.  Patient Active Problem List  Diagnosis  . ANXIETY DEPRESSION  . DEPRESSION, SITUATIONAL  . ADD  . SINUSITIS- ACUTE-NOS  . ALLERGIC RHINITIS  . DYSPHAGIA UNSPECIFIED  . 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 SPRAIN AND STRAIN OF UNSPECIFIED SITE OF BACK  . RENAL CALCULUS, HX OF  . Routine general medical examination at a health care facility   Past Medical History  Diagnosis Date  . Allergy    Past Surgical History  Procedure Date  . Wisdom tooth extraction   . Orif acetabular fracture 1999    Left 4th finger   History  Substance Use Topics  . Smoking status: Never Smoker   . Smokeless tobacco: Not on file  . Alcohol Use: Not on file   Family History  Problem Relation Age of Onset  . Hypertension Father   . Hypertension Maternal Aunt   . Diabetes Maternal Grandmother    Allergies  Allergen Reactions  . Amoxicillin     REACTION: itchy   Current Outpatient Prescriptions on File Prior to Visit  Medication Sig Dispense Refill  . azithromycin (ZITHROMAX) 250 MG tablet 2 tabs by mouth on day 1, followed by 1 tab by mouth daily days 2-5  6 tablet  0  . fluticasone (FLONASE) 50 MCG/ACT nasal spray Place 2 sprays into the nose daily.  16 g  6  .  Multiple Vitamin (MULTIVITAMIN) tablet Take 1 tablet by mouth daily.         The PMH, PSH, Social History, Family History, Medications, and allergies have been reviewed in [facility], and have been updated if relevant.   Review of Systems See HPI Patient reports no  vision/ hearing changes,anorexia, weight change, fever ,adenopathy, persistant / recurrent hoarseness, swallowing issues, chest pain, edema,persistant / recurrent cough, hemoptysis, dyspnea(rest, exertional, paroxysmal nocturnal), gastrointestinal  bleeding (melena, rectal bleeding), abdominal pain, excessive heart burn, GU symptoms(dysuria, hematuria, pyuria, voiding/incontinence  Issues) syncope, focal weakness, severe memory loss, concerning skin lesions, depression, anxiety, abnormal bruising/bleeding, major joint swelling, breast masses or abnormal vaginal bleeding.       Objective:   Physical Exam BP 102/62  Pulse 80  Temp 97.4 F (36.3 C)  Ht 5' 3.5" (1.613 m)  Wt 153 lb (69.4 kg)  BMI 26.68 kg/m2  General:  Well-developed,well-nourished,in no acute distress; alert,appropriate and cooperative throughout examination Head:  normocephalic and atraumatic.   Eyes:  vision grossly intact, pupils equal, pupils round, and pupils reactive to light.   Ears:  R ear normal and L ear normal.   Nose:  no external deformity.   Mouth:  good dentition.   Neck:  No deformities, masses, or tenderness noted. Lungs:  Normal respiratory effort, chest expands symmetrically. Lungs are clear to auscultation, no crackles or wheezes. Heart:  Normal rate and regular rhythm. S1 and S2 normal without gallop, murmur, click, rub or other extra sounds. Abdomen:  Bowel sounds positive,abdomen soft and non-tender without masses, organomegaly or hernias noted. Msk:  No deformity or scoliosis noted of thoracic or lumbar spine.   Extremities:  No clubbing, cyanosis, edema, or deformity noted with normal full range of motion of all joints.   Neurologic:  alert  & oriented X3 and gait normal.   Skin:  Intact without suspicious lesions or rashes Cervical Nodes:  No lymphadenopathy noted Psych:  Cognition and judgment appear intact. Alert and cooperative with normal attention span and concentration. No apparent delusions, illusions, hallucinations     Assessment & Plan:   1. Routine general medical examination at a health care facility  Reviewed preventive care protocols, scheduled due services, and updated immunizations Discussed nutrition, exercise, diet, and healthy lifestyle.    2. ANXIETY DEPRESSION  Deteriorated. Restart Celexa, as needed Xanax. Pt to call in 1 month with an update. The patient indicates understanding of these issues and agrees with the plan.

## 2012-12-22 ENCOUNTER — Other Ambulatory Visit: Payer: Self-pay | Admitting: Family Medicine

## 2012-12-22 NOTE — Telephone Encounter (Signed)
Not safe in women of child bearing age so I would like for this to not be a long term medication. Ok to refill 30 tablets with no refills.

## 2012-12-22 NOTE — Telephone Encounter (Signed)
Electronic refill request.  Recent CPE done, last refilled 11/16/12 #90 / 0 RF.  Please advise.

## 2012-12-22 NOTE — Telephone Encounter (Signed)
I am going to defer to PCP in this case.

## 2012-12-25 ENCOUNTER — Other Ambulatory Visit: Payer: Self-pay | Admitting: Family Medicine

## 2012-12-25 NOTE — Telephone Encounter (Signed)
Pt called and stated that she called [facility] in [place] to check the status of her Xanax refill, and was told by Chip Boer that it had been denied.  RX originally called in on 12/22/12.  Xanax 0.5mg  refill called in again to [facility].

## 2012-12-25 NOTE — Telephone Encounter (Signed)
Medicine called to pharmacy, left message on voice mail asking patient to call back.

## 2013-01-22 ENCOUNTER — Other Ambulatory Visit: Payer: Self-pay | Admitting: Family Medicine

## 2013-01-22 NOTE — Telephone Encounter (Signed)
Medicine called to [facility]. 

## 2013-02-28 ENCOUNTER — Other Ambulatory Visit: Payer: Self-pay | Admitting: Family Medicine

## 2013-02-28 NOTE — Telephone Encounter (Signed)
Pt left v/m requesting refill Alprazolam called to [facility]. Please advise.

## 2013-02-28 NOTE — Telephone Encounter (Signed)
Refill phoned in the [facility] pharmacy.

## 2013-03-27 ENCOUNTER — Other Ambulatory Visit: Payer: Self-pay

## 2013-03-27 MED ORDER — ALPRAZOLAM 0.5 MG PO TABS: ORAL_TABLET | ORAL | Status: DC

## 2013-03-27 NOTE — Telephone Encounter (Signed)
Pt left v/m requesting refill for alprazolam to [facility] [address].Please advise.

## 2013-03-28 ENCOUNTER — Other Ambulatory Visit: Payer: Self-pay | Admitting: Family Medicine

## 2013-03-28 NOTE — Telephone Encounter (Signed)
Medicine has been called to pharmacy.

## 2013-03-28 NOTE — Telephone Encounter (Signed)
Medicine called to [facility]. 

## 2013-04-25 ENCOUNTER — Other Ambulatory Visit: Payer: Self-pay | Admitting: Family Medicine

## 2013-04-25 NOTE — Telephone Encounter (Signed)
Medicine called to pharmacy. 

## 2013-05-14 ENCOUNTER — Other Ambulatory Visit: Payer: Self-pay

## 2013-05-14 NOTE — Telephone Encounter (Signed)
Pt left v/m requesting refill alprazolam to [facility] [address]Please advise.

## 2013-05-14 NOTE — Telephone Encounter (Signed)
Advised pharmacy.  They said pt has refill on hold, they will fill that when time.

## 2013-05-22 ENCOUNTER — Emergency Department: Payer: Self-pay | Admitting: Emergency Medicine

## 2013-05-23 LAB — CBC WITH DIFFERENTIAL/PLATELET
Basophil #: 0 10*3/uL (ref 0.0–0.1)
Eosinophil #: 0 10*3/uL (ref 0.0–0.7)
Eosinophil %: 0.5 %
HCT: 36.5 % (ref 35.0–47.0)
HGB: 13.1 g/dL (ref 12.0–16.0)
Lymphocyte #: 1.3 10*3/uL (ref 1.0–3.6)
Lymphocyte %: 14.3 %
MCH: 31.6 pg (ref 26.0–34.0)
Monocyte %: 8 %
Neutrophil #: 7.2 10*3/uL — ABNORMAL HIGH (ref 1.4–6.5)
Neutrophil %: 77 %
RBC: 4.14 10*6/uL (ref 3.80–5.20)
WBC: 9.4 10*3/uL (ref 3.6–11.0)

## 2013-05-23 LAB — BASIC METABOLIC PANEL
BUN: 8 mg/dL (ref 7–18)
Co2: 24 mmol/L (ref 21–32)
EGFR (African American): >60
EGFR (Non-African Amer.): >60
Glucose: 96 mg/dL (ref 65–99)
Osmolality: 270 (ref 275–301)
Potassium: 3.8 mmol/L (ref 3.5–5.1)
Sodium: 136 mmol/L (ref 136–145)

## 2013-05-23 LAB — MONONUCLEOSIS SCREEN: Mono Test: POSITIVE

## 2013-05-24 ENCOUNTER — Encounter: Payer: Self-pay | Admitting: Radiology

## 2013-05-25 ENCOUNTER — Encounter: Payer: Self-pay | Admitting: Internal Medicine

## 2013-05-25 ENCOUNTER — Ambulatory Visit (INDEPENDENT_AMBULATORY_CARE_PROVIDER_SITE_OTHER): Payer: PRIVATE HEALTH INSURANCE | Admitting: Family Medicine

## 2013-05-25 ENCOUNTER — Encounter: Payer: Self-pay | Admitting: Family Medicine

## 2013-05-25 VITALS — BP 118/72 | HR 89 | Temp 97.9°F | Wt 155.2 lb

## 2013-05-25 DIAGNOSIS — B2790 Infectious mononucleosis, unspecified without complication: Secondary | ICD-10-CM | POA: Insufficient documentation

## 2013-05-25 DIAGNOSIS — K219 Gastro-esophageal reflux disease without esophagitis: Secondary | ICD-10-CM

## 2013-05-25 LAB — BETA STREP CULTURE(ARMC)

## 2013-05-25 MED ORDER — HYDROCODONE-ACETAMINOPHEN 5-325 MG PO TABS: 1.0000 | ORAL_TABLET | Freq: Four times a day (QID) | ORAL | Status: DC | PRN

## 2013-05-25 MED ORDER — ESOMEPRAZOLE MAGNESIUM 40 MG PO CPDR: 40.0000 mg | DELAYED_RELEASE_CAPSULE | Freq: Every day | ORAL | Status: DC

## 2013-05-25 NOTE — Patient Instructions (Addendum)
Good to see you. Please take Nexium 40 mg daily (preferably in evening).  Please stop by to see Shirlee Limerick on your way out to set up your referral.  Diet for Gastroesophageal Reflux Disease, Adult Reflux (acid reflux) is when acid from your stomach flows up into the esophagus. When acid comes in contact with the esophagus, the acid causes irritation and soreness (inflammation) in the esophagus. When reflux happens often or so severely that it causes damage to the esophagus, it is called gastroesophageal reflux disease (GERD). Nutrition therapy can help ease the discomfort of GERD. FOODS OR DRINKS TO AVOID OR LIMIT  Smoking or chewing tobacco. Nicotine is one of the most potent stimulants to acid production in the gastrointestinal tract.  Caffeinated and decaffeinated coffee and black tea.  Regular or low-calorie carbonated beverages or energy drinks (caffeine-free carbonated beverages are allowed).   Strong spices, such as black pepper, white pepper, red pepper, cayenne, curry powder, and chili powder.  Peppermint or spearmint.  Chocolate.  High-fat foods, including meats and fried foods. Extra added fats including oils, butter, salad dressings, and nuts. Limit these to less than 8 tsp per day.  Fruits and vegetables if they are not tolerated, such as citrus fruits or tomatoes.  Alcohol.  Any food that seems to aggravate your condition. If you have questions regarding your diet, call your caregiver or a registered dietitian. OTHER THINGS THAT MAY HELP GERD INCLUDE:   Eating your meals slowly, in a relaxed setting.  Eating 5 to 6 small meals per day instead of 3 large meals.  Eliminating food for a period of time if it causes distress.  Not lying down until 3 hours after eating a meal.  Keeping the head of your bed raised 6 to 9 inches (15 to 23 cm) by using a foam wedge or blocks under the legs of the bed. Lying flat may make symptoms worse.  Being physically active. Weight loss  may be helpful in reducing reflux in overweight or obese adults.  Wear loose fitting clothing EXAMPLE MEAL PLAN This meal plan is approximately 2,000 calories based on https://www.bernard.org/ meal planning guidelines. Breakfast   cup cooked oatmeal.  1 cup strawberries.  1 cup low-fat milk.  1 oz almonds. Snack  1 cup cucumber slices.  6 oz yogurt (made from low-fat or fat-free milk). Lunch  2 slice whole-wheat bread.  2 oz sliced [place].  2 tsp mayonnaise.  1 cup blueberries.  1 cup snap peas. Snack  6 whole-wheat crackers.  1 oz string cheese. Dinner   cup brown rice.  1 cup mixed veggies.  1 tsp olive oil.  3 oz grilled fish. Document Released: 12/13/2005 Document Revised: 03/06/2012 Document Reviewed: 10/29/2011 [facility] Patient Information 2014 [place], [place].

## 2013-05-25 NOTE — Progress Notes (Signed)
Subjective:    Patient ID: Sabrina Robertson, female    DOB: Oct 03, 1981, 32 y.o.   MRN: [id number]  HPI 32 yo here for ER follow up.  Notes reviewed- seen at [facility] on 05/22/13 for severe and acute onset of burning in her chest, sore throat and body aches. Notes reviewed. She was afebrile but tachycardic at 103.  Monospot positive, rapid strep negative.  Diagnosed with mono and esophagitis.  Rx given for viscous lidocaine and advised to follow up with me today. CBC, BMET, CXR unremarkable.  Body aches are better but burning in her chest and throat are worse, especially at night.  She has been taking more Alleve and Ibuprofen.  Does not smoke.  She does drink ETOH.  Given GI cocktail in ER which did help a little but was not advised to continue taking an antacids.  "taking Tums like candy."  Patient Active Problem List   Diagnosis Date Noted  . Infectious mononucleosis 05/25/2013  . Acute esophagitis 05/25/2013  . ADD 04/03/2010  . DYSPHAGIA UNSPECIFIED 11/26/2009  . ANXIETY DEPRESSION 02/02/2008  . DEPRESSION, SITUATIONAL 01/18/2008  . ALLERGIC RHINITIS 01/18/2008  . RENAL CALCULUS, HX OF 01/18/2008  . 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 Allergy    Past Surgical History  Procedure Laterality Date  . Wisdom tooth extraction    . Orif acetabular fracture  1999    Left 4th finger   History  Substance Use Topics  . Smoking status: Never Smoker   . Smokeless tobacco: Not on file  . Alcohol Use: Yes     Comment: occ-weekend   Family History  Problem Relation Age of Onset  . Hypertension Father   . Hypertension Maternal Aunt   . Diabetes Maternal Grandmother    Allergies  Allergen Reactions  . Amoxicillin     REACTION: itchy   Current Outpatient Prescriptions on File Prior to Visit  Medication Sig Dispense Refill  . ALPRAZolam (XANAX) 0.5 MG tablet take 1 tablet by mouth three times a day if needed for  anxiety  30 tablet  0  . Multiple Vitamin (MULTIVITAMIN) tablet Take 1 tablet by mouth daily.        . Norethindrone-Ethinyl Estradiol-Fe Biphas (LO LOESTRIN FE) 1 MG-10 MCG / 10 MCG tablet Take 1 tablet by mouth daily.  1 Package  11   No current facility-administered medications on file prior to visit.   The PMH, PSH, Social History, Family History, Medications, and allergies have been reviewed in [facility], and have been updated if relevant.   Review of Systems See HPI No dark stools No vomiting     Objective:   Physical Exam BP 118/72  Pulse 89  Temp(Src) 97.9 F (36.6 C) (Oral)  Wt 155 lb 4 oz (70.421 kg)  BMI 27.07 kg/m2  SpO2 98%  LMP 05/23/2013  General:  Well-developed,well-nourished,in no acute distress; alert,appropriate and cooperative throughout examination Head:  normocephalic and atraumatic.   Eyes:  vision grossly intact, pupils equal, pupils round, and pupils reactive to light.   Ears:  R ear normal and L ear normal.   Nose:  no external deformity.   Mouth:  good dentition.   Mild eryythema, no exudate or edema Lungs:  Normal respiratory effort, chest expands symmetrically. Lungs are clear to auscultation, no crackles or wheezes. Heart:  Normal rate and regular rhythm. S1 and S2 normal without gallop, murmur, click, rub or other extra  sounds. Neurologic:  alert & oriented X3 and gait normal.   Skin:  Intact without suspicious lesions or rashes Cervical Nodes:  No lymphadenopathy noted Axillary Nodes:  No palpable lymphadenopathy Psych:  Cognition and judgment appear intact. Alert and cooperative with normal attention span and concentration. No apparent delusions, illusions, hallucinations        Assessment & Plan:  1. Infectious mononucleosis Discussed supportive care- advised STOPPING NSAIDS. The patient indicates understanding of these issues and agrees with the plan.   2. GERD (gastroesophageal reflux disease) Given samples for nexium 40 mg daily x 2  weeks.  Abdominal exam reassuring- no signs of perf. Discussed GERD friendly diet - see AVS. Will refer to GI for endoscopy. The patient indicates understanding of these issues and agrees with the plan.  - Ambulatory referral to Gastroenterology

## 2013-05-28 ENCOUNTER — Telehealth: Payer: Self-pay | Admitting: *Deleted

## 2013-05-28 NOTE — Telephone Encounter (Signed)
Reason for Call: Caller: [name]/Patient; PCP: Ruthe Mannan (Family Practice); CB#: [phone]; LMP: 05/20/13. 05/26/13 - Patient seen in ER on Wednesday 05/23/13 and diagnosed with Mono. She was seen at the office on Friday 05/25/13 for follow-up and because they didn't treat her for severe heart burn. Patient was given Nexium and Percocet. Today around 2 pm patient was so dizzy she became nauseous and has been throwing up and it is dark brown. Afebrile. Emergent Sign/Symptom of "signs of dehydration" per Nausea or Vomiting Protocol (disposition-see in emergency room). Advised Patient to be seen and evaluated at the ER. Patient Agreed. Care Advice Given. Protocol(s) Used: Nausea or Vomiting Recommended Outcome per Protocol: See ED Immediately Reason for Outcome: Signs of dehydration Care Advice: ~ Protect the patient from falling or other harm. ~ Another adult should drive. Write down provider's name. List or place the following in a bag for transport with the patient: current prescription and/or nonprescription medications; alternative treatments, therapies and medications; and street drugs. ~ Take sips of clear liquids (such as water, clear fruit juices without pulp, soda, tea or coffee without dairy or non-dairy creamer, clear broth or bouillon, oral hydration solution, or plain gelatin, fruit ices/popsicles, hard candy) as tolerated. Sucking on ice chips is another option. ~ 05/

## 2013-05-28 NOTE — Telephone Encounter (Signed)
Please call to check on pt. 

## 2013-05-29 ENCOUNTER — Ambulatory Visit (INDEPENDENT_AMBULATORY_CARE_PROVIDER_SITE_OTHER): Payer: PRIVATE HEALTH INSURANCE | Admitting: Family Medicine

## 2013-05-29 ENCOUNTER — Encounter: Payer: Self-pay | Admitting: Family Medicine

## 2013-05-29 VITALS — BP 102/70 | HR 100 | Temp 97.7°F | Wt 151.0 lb

## 2013-05-29 DIAGNOSIS — H8110 Benign paroxysmal vertigo, unspecified ear: Secondary | ICD-10-CM | POA: Insufficient documentation

## 2013-05-29 DIAGNOSIS — K219 Gastro-esophageal reflux disease without esophagitis: Secondary | ICD-10-CM

## 2013-05-29 MED ORDER — MECLIZINE HCL 25 MG PO TABS: 25.0000 mg | ORAL_TABLET | Freq: Three times a day (TID) | ORAL | Status: DC | PRN

## 2013-05-29 NOTE — Progress Notes (Signed)
Subjective:    Patient ID: Sabrina Robertson, female    DOB: 09-15-1981, 32 y.o.   MRN: [id number]  HPI 32 yo here for "not feeling better."  I saw her 4 days ago for ER follow up.  Notes reviewed- seen at [facility] on 05/22/13 for severe and acute onset of burning in her chest, sore throat and body aches. Notes reviewed. She was afebrile but tachycardic at 103.  Monospot positive, rapid strep negative.  Diagnosed with mono and esophagitis.  Rx given for viscous lidocaine and advised to follow up with me today. CBC, BMET, CXR unremarkable.  Discussed GERD friendly diet, started on daily Nexium and referred to GI for endoscopy/further work up.  She has appt with Dr. Rhea Belton at end of the month. Nexium was helping with burning and GERD symptoms but past 3 days, room is spinning when she is lays down or changes head positions.  It also makes her nauseated and she has vomited 3 times.  No diarrhea. No other focal neurological symptoms.  Patient Active Problem List   Diagnosis Date Noted  . GERD (gastroesophageal reflux disease) 05/29/2013  . Infectious mononucleosis 05/25/2013  . Acute esophagitis 05/25/2013  . ADD 04/03/2010  . DYSPHAGIA UNSPECIFIED 11/26/2009  . ANXIETY DEPRESSION 02/02/2008  . DEPRESSION, SITUATIONAL 01/18/2008  . ALLERGIC RHINITIS 01/18/2008  . RENAL CALCULUS, HX OF 01/18/2008  . 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 Allergy    Past Surgical History  Procedure Laterality Date  . Wisdom tooth extraction    . Orif acetabular fracture  1999    Left 4th finger   History  Substance Use Topics  . Smoking status: Never Smoker   . Smokeless tobacco: Not on file  . Alcohol Use: Yes     Comment: occ-weekend   Family History  Problem Relation Age of Onset  . Hypertension Father   . Hypertension Maternal Aunt   . Diabetes Maternal Grandmother    Allergies  Allergen Reactions  . Amoxicillin    REACTION: itchy   Current Outpatient Prescriptions on File Prior to Visit  Medication Sig Dispense Refill  . ALPRAZolam (XANAX) 0.5 MG tablet take 1 tablet by mouth three times a day if needed for anxiety  30 tablet  0  . esomeprazole (NEXIUM) 40 MG capsule Take 1 capsule (40 mg total) by mouth daily.  30 capsule  3  . HYDROcodone-acetaminophen (NORCO/VICODIN) 5-325 MG per tablet Take 1 tablet by mouth every 6 (six) hours as needed for pain.  20 tablet  0  . Multiple Vitamin (MULTIVITAMIN) tablet Take 1 tablet by mouth daily.        . Norethindrone-Ethinyl Estradiol-Fe Biphas (LO LOESTRIN FE) 1 MG-10 MCG / 10 MCG tablet Take 1 tablet by mouth daily.  1 Package  11   No current facility-administered medications on file prior to visit.   The PMH, PSH, Social History, Family History, Medications, and allergies have been reviewed in [facility], and have been updated if relevant.   Review of Systems See HPI No dark stools No vomiting     Objective:   Physical Exam BP 102/70  Pulse 100  Temp(Src) 97.7 F (36.5 C)  Wt 151 lb (68.493 kg)  BMI 26.33 kg/m2  LMP 05/23/2013  General:  Well-developed,well-nourished,in no acute distress; alert,appropriate and cooperative throughout examination Head:  normocephalic and atraumatic.   Eyes:  + Epley's (left).  Ears:  R ear normal and  L ear normal.   Nose:  no external deformity.   Lungs:  Normal respiratory effort, chest expands symmetrically. Lungs are clear to auscultation, no crackles or wheezes. Heart:  Normal rate and regular rhythm. S1 and S2 normal without gallop, murmur, click, rub or other extra sounds. Neurologic:  alert & oriented X3 and gait normal.   Skin:  Intact without suspicious lesions or rashes Cervical Nodes:  No lymphadenopathy noted Axillary Nodes:  No palpable lymphadenopathy Psych:  Cognition and judgment appear intact. Alert and cooperative with normal attention span and concentration. No apparent delusions, illusions,  hallucinations Neuro:  Normal gait   Assessment & Plan:   1. GERD (gastroesophageal reflux disease) Was improving- continue Nexium. Vomiting likely acutely worsened symptoms.  See below.  2. Benign paroxysmal positional vertigo New- likely secondary to recent illness. Will treat with Meclizine. If symptoms persist, refer for vestibular PT. The patient indicates understanding of these issues and agrees with the plan.

## 2013-05-29 NOTE — Patient Instructions (Addendum)
Good to see you. I think you now have vertigo.  Please take Meclizine as directed-  1 tablet three times daily for next 1- 2 days.  Please call me with an update.  Vertigo Vertigo means you feel like you or your surroundings are moving when they are not. Vertigo can be dangerous if it occurs when you are at work, driving, or performing difficult activities.  CAUSES  Vertigo occurs when there is a conflict of signals sent to your brain from the visual and sensory systems in your body. There are many different causes of vertigo, including:  Infections, especially in the inner ear.  Viruses  A bad reaction to a drug or misuse of alcohol and medicines.  Withdrawal from drugs or alcohol.  Rapidly changing positions, such as lying down or rolling over in bed.  A migraine headache.  Decreased blood flow to the brain.  Increased pressure in the brain from a head injury, infection, tumor, or bleeding. SYMPTOMS  You may feel as though the world is spinning around or you are falling to the ground. Because your balance is upset, vertigo can cause nausea and vomiting. You may have involuntary eye movements (nystagmus). DIAGNOSIS  Vertigo is usually diagnosed by physical exam. If the cause of your vertigo is unknown, your caregiver may perform imaging tests, such as an MRI scan (magnetic resonance imaging). TREATMENT  Most cases of vertigo resolve on their own, without treatment. Depending on the cause, your caregiver may prescribe certain medicines. If your vertigo is related to body position issues, your caregiver may recommend movements or procedures to correct the problem. In rare cases, if your vertigo is caused by certain inner ear problems, you may need surgery. HOME CARE INSTRUCTIONS   Follow your caregiver's instructions.  Avoid driving.  Avoid operating heavy machinery.  Avoid performing any tasks that would be dangerous to you or others during a vertigo episode.  Tell your  caregiver if you notice that certain medicines seem to be causing your vertigo. Some of the medicines used to treat vertigo episodes can actually make them worse in some people. SEEK IMMEDIATE MEDICAL CARE IF:   Your medicines do not relieve your vertigo or are making it worse.  You develop problems with talking, walking, weakness, or using your arms, hands, or legs.  You develop severe headaches.  Your nausea or vomiting continues or gets worse.  You develop visual changes.  A family member notices behavioral changes.  Your condition gets worse. MAKE SURE YOU:  Understand these instructions.  Will watch your condition.  Will get help right away if you are not doing well or get worse. Document Released: 09/22/2005 Document Revised: 03/06/2012 Document Reviewed: 07/01/2011 [facility] Patient Information 2014 [place], [place].

## 2013-05-29 NOTE — Telephone Encounter (Signed)
Pt has appt to see Dr. Dayton Martes today.

## 2013-06-13 ENCOUNTER — Other Ambulatory Visit: Payer: Self-pay | Admitting: Family Medicine

## 2013-06-13 ENCOUNTER — Other Ambulatory Visit: Payer: Self-pay

## 2013-06-13 MED ORDER — ALPRAZOLAM 0.5 MG PO TABS: ORAL_TABLET | ORAL | Status: DC

## 2013-06-13 MED ORDER — TRAMADOL HCL 50 MG PO TABS: ORAL_TABLET | ORAL | Status: DC

## 2013-06-13 NOTE — Telephone Encounter (Signed)
Let's try tramadol instead.  She should not be needing a narcotic at this point. Ok to refill xanax.  Tramadol 50 mg - 1 tab po bid as needed for pain.  #30 with no refills.

## 2013-06-13 NOTE — Telephone Encounter (Signed)
Pt left v/m requesting refill on hydrocodone apap and xanax. Pt said throat got better but has gotten very sore and raw again. Pt does not think has fever but has cold sweats on and off. Advil makes heartburn worse so pt request hydrocodone to [facility].

## 2013-06-13 NOTE — Telephone Encounter (Signed)
Advised patient.  Medicines sent to [facility].

## 2013-06-13 NOTE — Telephone Encounter (Signed)
See other phone note from today.

## 2013-06-14 NOTE — Addendum Note (Signed)
Addended by: Sueanne Margarita on: 06/14/2013 10:58 AM   Modules accepted: Orders

## 2013-06-18 ENCOUNTER — Encounter: Payer: Self-pay | Admitting: Internal Medicine

## 2013-06-19 ENCOUNTER — Ambulatory Visit: Payer: PRIVATE HEALTH INSURANCE | Admitting: Internal Medicine

## 2013-06-25 ENCOUNTER — Other Ambulatory Visit: Payer: Self-pay | Admitting: Family Medicine

## 2013-06-25 NOTE — Telephone Encounter (Signed)
Pt has not had pap at our office since 03/18/2011.  OV note from 11/16/12 states pt should be having appt with GYN in 12/2012.  Should her GYN be the one to fill this?

## 2013-06-25 NOTE — Telephone Encounter (Signed)
Yes GYN should fill this.

## 2013-06-25 NOTE — Telephone Encounter (Signed)
[facility] Abts at [facility].  [facility] says [facility] did not [facility] request, they dont have this medicine on her med list there.

## 2013-06-27 ENCOUNTER — Other Ambulatory Visit: Payer: Self-pay | Admitting: Family Medicine

## 2013-07-20 ENCOUNTER — Other Ambulatory Visit: Payer: Self-pay

## 2013-07-20 MED ORDER — ALPRAZOLAM 0.5 MG PO TABS: ORAL_TABLET | ORAL | Status: DC

## 2013-07-20 NOTE — Telephone Encounter (Signed)
Pt left v/m requesting refill xanax to [facility] [address]Please advise.

## 2013-07-20 NOTE — Telephone Encounter (Signed)
Alprazolam called to [facility].

## 2013-07-20 NOTE — Telephone Encounter (Signed)
Pt left v/m returning call; advised pt alprazolam was called to [facility]. Pt voiced understanding.

## 2013-08-17 ENCOUNTER — Other Ambulatory Visit: Payer: Self-pay

## 2013-08-17 MED ORDER — ALPRAZOLAM 0.5 MG PO TABS: ORAL_TABLET | ORAL | Status: DC

## 2013-08-17 NOTE — Telephone Encounter (Signed)
Pt left vm requesting refill on medication (could not understand name of med) to [facility] [address]. I called [facility] and pt has not requested refill; no answer on pts phone and no v/m set up. Will try later.

## 2013-08-17 NOTE — Telephone Encounter (Signed)
Pt called back requesting refill xanax to [facility] [address] Please advise.

## 2013-08-17 NOTE — Telephone Encounter (Signed)
[facility]ill [facility]. 

## 2013-09-03 ENCOUNTER — Encounter: Payer: Self-pay | Admitting: Family Medicine

## 2013-09-03 ENCOUNTER — Ambulatory Visit (INDEPENDENT_AMBULATORY_CARE_PROVIDER_SITE_OTHER): Payer: PRIVATE HEALTH INSURANCE | Admitting: Family Medicine

## 2013-09-03 VITALS — BP 100/62 | HR 70 | Temp 97.5°F | Wt 157.0 lb

## 2013-09-03 DIAGNOSIS — N912 Amenorrhea, unspecified: Secondary | ICD-10-CM

## 2013-09-03 DIAGNOSIS — Z3201 Encounter for pregnancy test, result positive: Secondary | ICD-10-CM

## 2013-09-03 NOTE — Patient Instructions (Addendum)
Good to see you. Hang in there. Please do NOT take any more xanax.  We will call you with your OBGYN appointment.  Please start taking a prenatal vitamin daily.  Call [facility] today to get more information about your options.

## 2013-09-03 NOTE — Progress Notes (Signed)
  Subjective:    Patient ID: Sabrina Robertson, female    DOB: 10-04-1981, 32 y.o.   MRN: [id number]  HPI  32 yo G0 here for ? Pregnancy.  LMP approx 6 weeks ago.  Post HPT over the weekend.  She has had some nausea, fatigue and breast tenderness.  Her partner is aware that she may be pregnant and he is being supportive.  Patient Active Problem List   Diagnosis Date Noted  . GERD (gastroesophageal reflux disease) 05/29/2013  . Benign paroxysmal positional vertigo 05/29/2013  . Infectious mononucleosis 05/25/2013  . Acute esophagitis 05/25/2013  . ADD 04/03/2010  . DYSPHAGIA UNSPECIFIED 11/26/2009  . ANXIETY DEPRESSION 02/02/2008  . DEPRESSION, SITUATIONAL 01/18/2008  . ALLERGIC RHINITIS 01/18/2008  . RENAL CALCULUS, HX OF 01/18/2008  . 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 Allergy    Past Surgical History  Procedure Laterality Date  . Wisdom tooth extraction    . Orif acetabular fracture  1999    Left 4th finger   History  Substance Use Topics  . Smoking status: Never Smoker   . Smokeless tobacco: Not on file  . Alcohol Use: Yes     Comment: occ-weekend   Family History  Problem Relation Age of Onset  . Hypertension Father   . Hypertension Maternal Aunt   . Diabetes Maternal Grandmother    Allergies  Allergen Reactions  . Amoxicillin     REACTION: itchy   Current Outpatient Prescriptions on File Prior to Visit  Medication Sig Dispense Refill  . Multiple Vitamin (MULTIVITAMIN) tablet Take 1 tablet by mouth daily.         No current facility-administered medications on file prior to visit.   The PMH, PSH, Social History, Family History, Medications, and allergies have been reviewed in [facility], and have been updated if relevant.   Review of Systems No vaginal bleeding    Objective:   Physical Exam BP 100/62  Pulse 70  Temp(Src) 97.5 F (36.4 C)  Wt 157 lb (71.215 kg)  BMI 27.37  kg/m2  General:  Well-developed,well-nourished,in no acute distress; alert,appropriate and cooperative throughout examination Head:  normocephalic and atraumatic.   Psych:  Cognition and judgment appear intact. Alert and cooperative with normal attention span and concentration. No apparent delusions, illusions, hallucinations        Assessment & Plan:  1. Amenorrhea Pos U preg. - POCT urine pregnancy  2. Positive pregnancy test >25 min spent with face to face with patient, >50% counseling and/or coordinating care.  She has stopped taking xanax and aware she should not restart it.  Has a good support system.  Not sure what she wants to do yet with this pregnancy. Advised starting PNV.  Refer to OBGYN- has been seen at [facility] in past.   - Ambulatory referral to Obstetrics / Gynecology

## 2013-09-18 ENCOUNTER — Encounter: Payer: Self-pay | Admitting: Family Medicine

## 2013-09-18 ENCOUNTER — Other Ambulatory Visit: Payer: Self-pay | Admitting: *Deleted

## 2013-09-18 NOTE — Telephone Encounter (Signed)
Done

## 2013-09-18 NOTE — Telephone Encounter (Signed)
Message from MyChart.  Please advise.

## 2013-09-19 MED ORDER — ALPRAZOLAM 0.5 MG PO TABS: ORAL_TABLET | ORAL | Status: DC

## 2013-09-20 NOTE — Telephone Encounter (Signed)
Medication phoned to pharmacy.  

## 2013-10-02 ENCOUNTER — Other Ambulatory Visit: Payer: Self-pay

## 2013-10-02 NOTE — Telephone Encounter (Signed)
Pt left v/m requesting refill alprazolam to [facility]. Spoke with Pheefe at [facility] and 09/18/13 rx is ready for pick up.pt notified.

## 2013-10-14 ENCOUNTER — Encounter: Payer: Self-pay | Admitting: Family Medicine

## 2013-10-15 NOTE — Telephone Encounter (Signed)
Please check with your insurance company and see what they will cover at a lower co pay and let Dr. Dayton Martes know.

## 2013-10-17 IMAGING — CR DG CHEST 2V
1 series · 2 of 2 positions shown · non-contrast
Comparison: none

REASON FOR EXAM: chest pain
COMMENTS:

PROCEDURE:     DXR - DXR CHEST PA (OR AP) AND LATERAL  - May 23, 2013  [DATE]
RESULT:     The lungs are clear. The cardiac silhouette and visualized bony
skeleton are unremarkable.

[Series 1: w chest pa · 0.14mm/px · 2 of 2 slices shown]
[im 1/2]
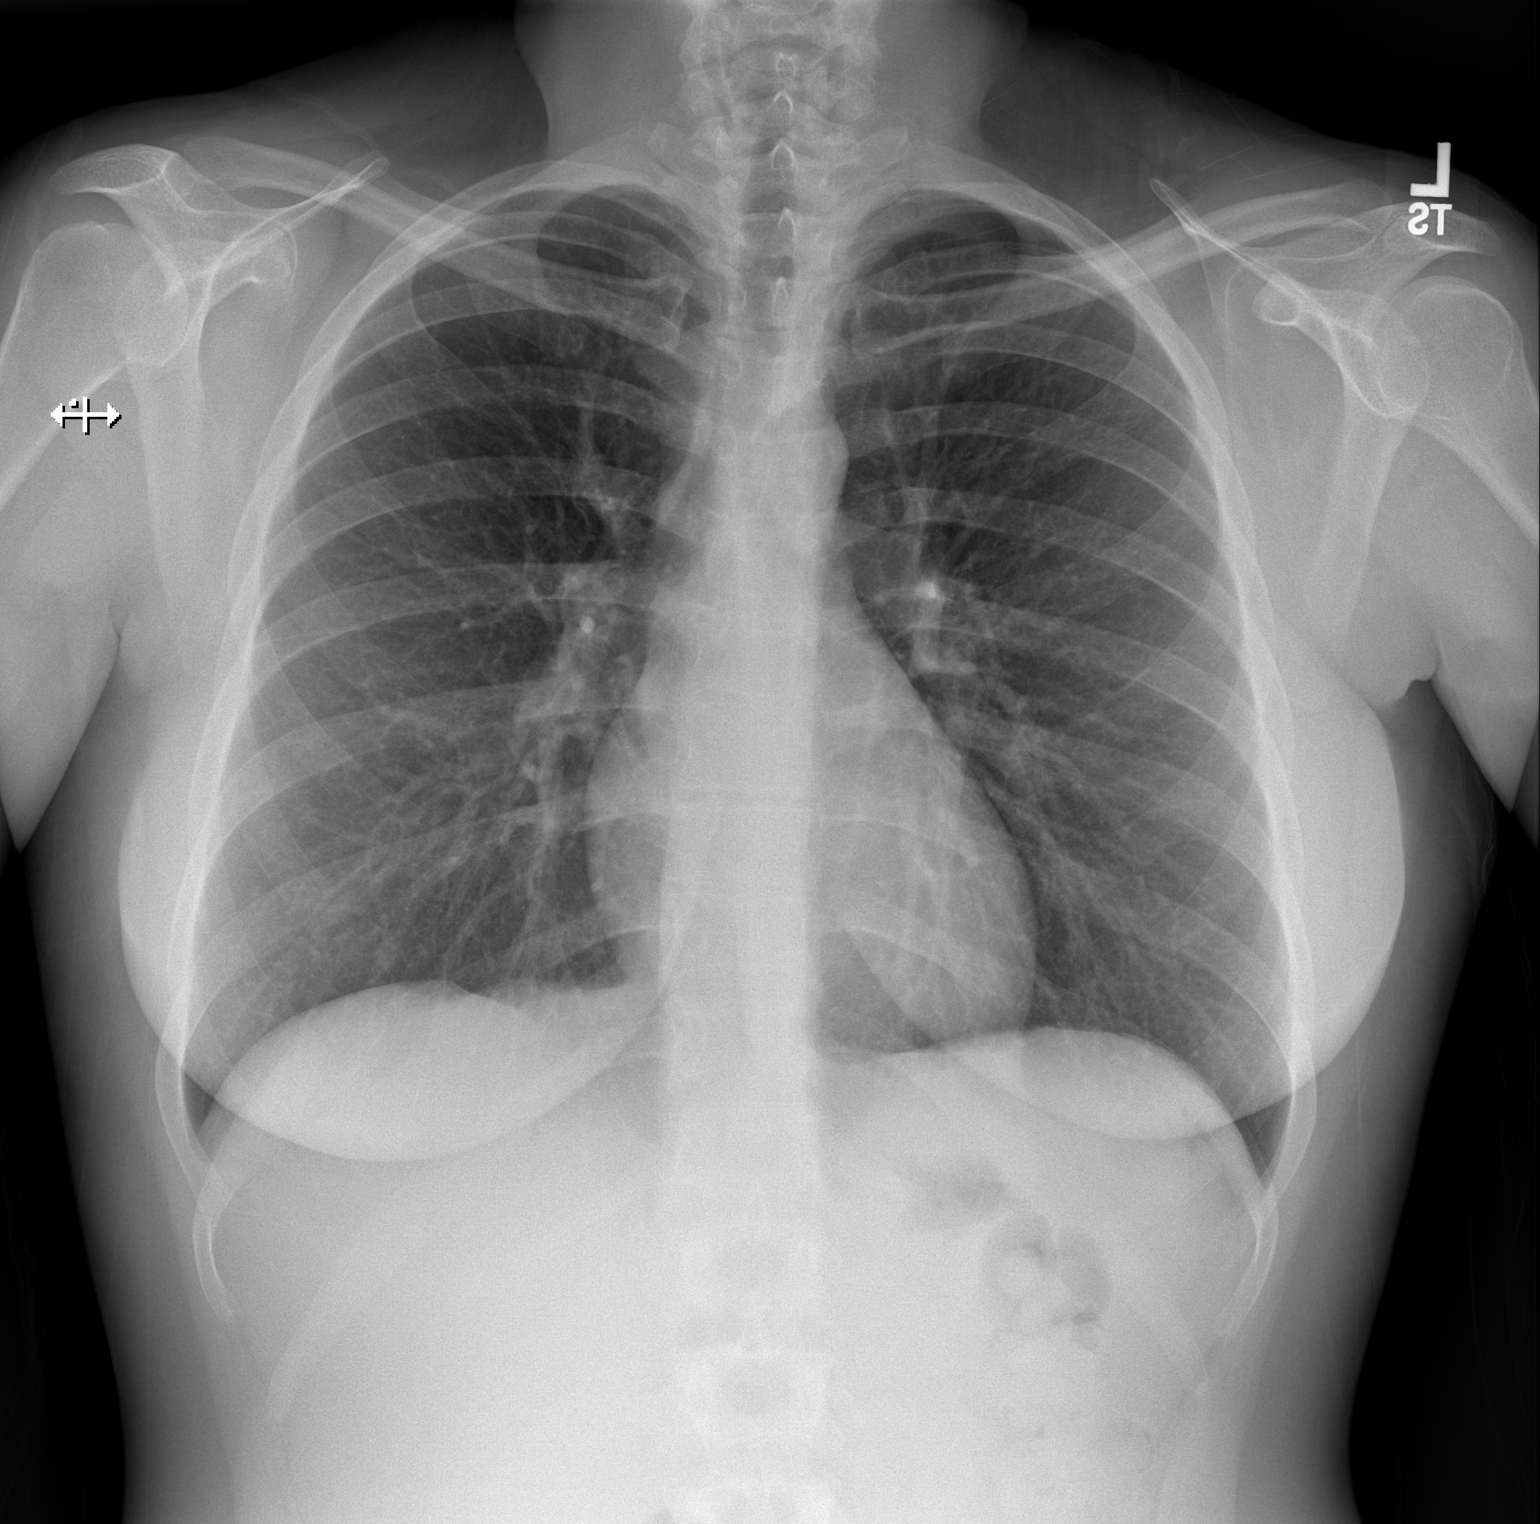
[im 2/2]
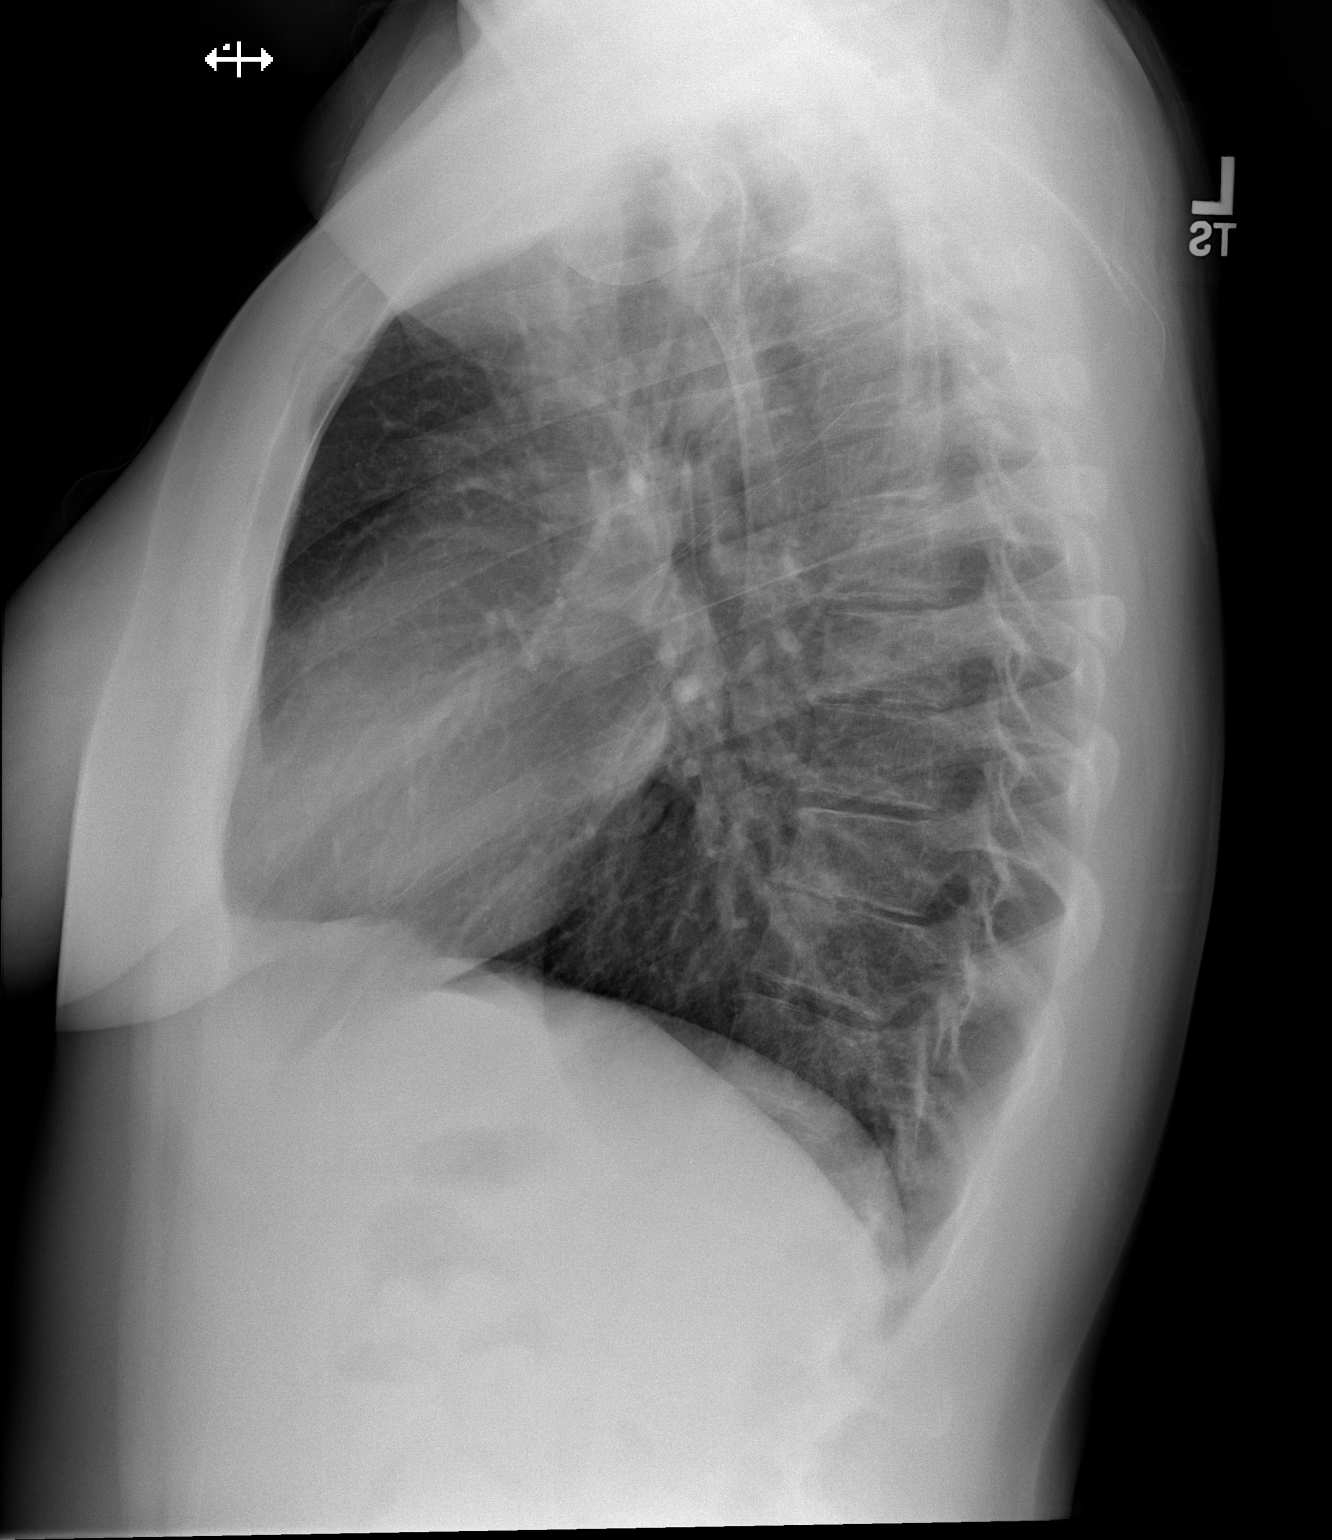

[2 of 2 positions shown; findings below may reference images not displayed]

IMPRESSION: 1. Chest radiograph without evidence of acute cardiopulmonary disease.

## 2013-10-22 ENCOUNTER — Other Ambulatory Visit: Payer: Self-pay | Admitting: Family Medicine

## 2013-10-22 MED ORDER — ALPRAZOLAM 0.5 MG PO TABS: ORAL_TABLET | ORAL | Status: DC

## 2013-10-22 NOTE — Telephone Encounter (Signed)
Ok to refill 

## 2013-10-22 NOTE — Telephone Encounter (Signed)
This is a duplicate request

## 2013-10-22 NOTE — Telephone Encounter (Signed)
Rx called to pharmacy as instructed. 

## 2013-11-01 ENCOUNTER — Other Ambulatory Visit: Payer: Self-pay

## 2013-11-19 ENCOUNTER — Telehealth: Payer: Self-pay | Admitting: Internal Medicine

## 2013-11-19 MED ORDER — ALPRAZOLAM 0.5 MG PO TABS: ORAL_TABLET | ORAL | Status: DC

## 2013-11-19 NOTE — Telephone Encounter (Signed)
Medication phoned to [facility] [address] [facility] as instructed.

## 2013-12-17 ENCOUNTER — Telehealth: Payer: Self-pay | Admitting: Internal Medicine

## 2013-12-17 MED ORDER — ALPRAZOLAM 0.5 MG PO TABS: ORAL_TABLET | ORAL | Status: DC

## 2013-12-17 NOTE — Telephone Encounter (Signed)
Rx called in to pharmacy. 

## 2013-12-17 NOTE — Telephone Encounter (Signed)
Please phone in xanax 

## 2014-01-15 ENCOUNTER — Other Ambulatory Visit: Payer: Self-pay | Admitting: Internal Medicine

## 2014-01-16 ENCOUNTER — Other Ambulatory Visit: Payer: Self-pay

## 2014-01-16 MED ORDER — ALPRAZOLAM 0.5 MG PO TABS: ORAL_TABLET | ORAL | Status: DC

## 2014-01-16 NOTE — Telephone Encounter (Signed)
Rx called in to pharmacy. 

## 2014-01-16 NOTE — Telephone Encounter (Signed)
Last filled 12/17/13 by Regina--please advise--please let pt know if approved and called in

## 2014-02-23 ENCOUNTER — Other Ambulatory Visit: Payer: Self-pay | Admitting: Family Medicine

## 2014-02-26 ENCOUNTER — Other Ambulatory Visit: Payer: Self-pay | Admitting: Family Medicine

## 2014-02-26 MED ORDER — ALPRAZOLAM 0.5 MG PO TABS: ORAL_TABLET | ORAL | Status: DC

## 2014-02-26 NOTE — Telephone Encounter (Signed)
Pt requesting medication refill. Last ov 08/2013 with no future appts scheduled. pls advise. 

## 2014-02-27 NOTE — Telephone Encounter (Signed)
Attempted to contact pt, but vm has not been established; message sent through [facility] informing her Rx called into requested pharmacy

## 2014-03-29 ENCOUNTER — Other Ambulatory Visit: Payer: Self-pay | Admitting: Family Medicine

## 2014-04-01 ENCOUNTER — Other Ambulatory Visit: Payer: Self-pay | Admitting: Family Medicine

## 2014-04-02 MED ORDER — ALPRAZOLAM 0.5 MG PO TABS: ORAL_TABLET | ORAL | Status: DC

## 2014-04-02 NOTE — Telephone Encounter (Signed)
Pt requesting medication refill. Last ov 08/2013 with no future appts scheduled. pls advise. 

## 2014-04-02 NOTE — Telephone Encounter (Signed)
Spoke to pt and informed her Rx has been called in to requested pharmacy. Pt informed OV required for additional refills.

## 2014-04-26 ENCOUNTER — Ambulatory Visit (INDEPENDENT_AMBULATORY_CARE_PROVIDER_SITE_OTHER): Payer: PRIVATE HEALTH INSURANCE | Admitting: Family Medicine

## 2014-04-26 ENCOUNTER — Encounter: Payer: Self-pay | Admitting: Family Medicine

## 2014-04-26 ENCOUNTER — Encounter: Payer: Self-pay | Admitting: *Deleted

## 2014-04-26 ENCOUNTER — Telehealth: Payer: Self-pay | Admitting: Family Medicine

## 2014-04-26 DIAGNOSIS — S7290XA Unspecified fracture of unspecified femur, initial encounter for closed fracture: Secondary | ICD-10-CM

## 2014-04-26 DIAGNOSIS — Z331 Pregnant state, incidental: Secondary | ICD-10-CM

## 2014-04-26 DIAGNOSIS — Z3490 Encounter for supervision of normal pregnancy, unspecified, unspecified trimester: Secondary | ICD-10-CM

## 2014-04-26 DIAGNOSIS — S52202A Unspecified fracture of shaft of left ulna, initial encounter for closed fracture: Secondary | ICD-10-CM

## 2014-04-26 DIAGNOSIS — S5290XA Unspecified fracture of unspecified forearm, initial encounter for closed fracture: Secondary | ICD-10-CM

## 2014-04-26 DIAGNOSIS — F4310 Post-traumatic stress disorder, unspecified: Secondary | ICD-10-CM | POA: Insufficient documentation

## 2014-04-26 DIAGNOSIS — S5292XA Unspecified fracture of left forearm, initial encounter for closed fracture: Secondary | ICD-10-CM

## 2014-04-26 DIAGNOSIS — S7292XA Unspecified fracture of left femur, initial encounter for closed fracture: Secondary | ICD-10-CM | POA: Insufficient documentation

## 2014-04-26 NOTE — Progress Notes (Deleted)
Pre visit review using our clinic review tool, if applicable. No additional management support is needed unless otherwise documented below in the visit note. 

## 2014-04-26 NOTE — Assessment & Plan Note (Signed)
Having elective abortion on Tuesday.

## 2014-04-26 NOTE — Progress Notes (Signed)
Pre visit review using our clinic review tool, if applicable. No additional management support is needed unless otherwise documented below in the visit note. 

## 2014-04-26 NOTE — Assessment & Plan Note (Signed)
Follow up with ortho scheduled. Pain is most severe in this area. Discussed pain control- if I take over managing this, perhaps we could try something longer acting like oxycontin to get ahead of the pain.  She will talk with her rehab docs. The patient indicates understanding of these issues and agrees with the plan.

## 2014-04-26 NOTE — Assessment & Plan Note (Signed)
Continue Ativan, psychotherapy. Good support at home.

## 2014-04-26 NOTE — Patient Instructions (Addendum)
Great to see you. Please call your rehab doctor to find out if they want me to take over your pain medications.  Keep in touch.
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
## 2014-04-26 NOTE — Telephone Encounter (Signed)
Attempted to contact pt; vm not set up and unable to leave message

## 2014-04-26 NOTE — Assessment & Plan Note (Signed)
With nerve palsy but it's improving with OT. Can now bend wrist and couldn't do so previously.

## 2014-04-26 NOTE — Telephone Encounter (Signed)
Will await UDS results. ? Mention in care everywhere of cocaine use.  I will not prescribe anything if she is taking illegal drugs and we will need to randomly drug screen her monthly if I do prescribe it.

## 2014-04-26 NOTE — Telephone Encounter (Signed)
Pt called and said she was just in today to see Dr. Dayton MartesAron and they had discussed starting her on stronger pain medication.  She said she spoke with her Case Manager and she was told that Dr. Dayton MartesAron is free to prescribe whatever she thinks will work better (she mentioned Oxycontin) and that she is not under contract or care per her Case Production designer, theatre/television/filmManager.  She requests cb [phone].

## 2014-04-29 NOTE — Telephone Encounter (Signed)
Patient notified as instructed by telephone. Patient stated that [facility] and will see Dr. Dayton MartesAron Wednesday.

## 2014-04-29 NOTE — Telephone Encounter (Signed)
Pt left v/m requesting status of pain med; pt still in a lot of pain.pt request cb.

## 2014-04-29 NOTE — Telephone Encounter (Signed)
I have already communicated with her via email twice today.  I informed her that since she did test positive for cocaine she needs to come in to see me first and agree to monthly random drug screens before I take over her narcotics.  She is seeing me on Wednesday. If she needs meds before this, she should call [facility].

## 2014-05-01 ENCOUNTER — Ambulatory Visit (INDEPENDENT_AMBULATORY_CARE_PROVIDER_SITE_OTHER): Payer: PRIVATE HEALTH INSURANCE | Admitting: Family Medicine

## 2014-05-01 ENCOUNTER — Encounter: Payer: Self-pay | Admitting: Family Medicine

## 2014-05-01 VITALS — BP 100/64 | HR 88 | Temp 97.7°F | Wt 152.5 lb

## 2014-05-01 DIAGNOSIS — Z7189 Other specified counseling: Secondary | ICD-10-CM | POA: Insufficient documentation

## 2014-05-01 DIAGNOSIS — F1491 Cocaine use, unspecified, in remission: Secondary | ICD-10-CM

## 2014-05-01 DIAGNOSIS — Z87898 Personal history of other specified conditions: Secondary | ICD-10-CM

## 2014-05-01 DIAGNOSIS — F1411 Cocaine abuse, in remission: Secondary | ICD-10-CM

## 2014-05-01 MED ORDER — LORAZEPAM 0.5 MG PO TABS: 0.5000 mg | ORAL_TABLET | Freq: Three times a day (TID) | ORAL | Status: DC

## 2014-05-01 MED ORDER — OXYCODONE HCL ER 10 MG PO T12A: 10.0000 mg | EXTENDED_RELEASE_TABLET | Freq: Two times a day (BID) | ORAL | Status: DC

## 2014-05-01 NOTE — Progress Notes (Signed)
Pre visit review using our clinic review tool, if applicable. No additional management support is needed unless otherwise documented below in the visit note. 

## 2014-05-01 NOTE — Assessment & Plan Note (Signed)
>  25 minutes spent in face to face time with patient, >50% spent in counselling or coordination of care Long talk with Morrie SheldonAshley about pain management.  I am willing to manage her pain since this is not a long term situation but she has to come see me monthly and take monthly UDS.  If she tests positive for any illegal substances, she will be discharged. The patient indicates understanding of these issues and agrees with the plan.  Will start oxycontin 10 mg twice daily.
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
## 2014-05-02 NOTE — Telephone Encounter (Signed)
Error

## 2014-05-09 ENCOUNTER — Encounter: Payer: Self-pay | Admitting: Family Medicine

## 2014-05-13 ENCOUNTER — Encounter: Payer: Self-pay | Admitting: Family Medicine

## 2014-05-13 ENCOUNTER — Ambulatory Visit (INDEPENDENT_AMBULATORY_CARE_PROVIDER_SITE_OTHER): Payer: PRIVATE HEALTH INSURANCE | Admitting: Family Medicine

## 2014-05-13 ENCOUNTER — Ambulatory Visit (INDEPENDENT_AMBULATORY_CARE_PROVIDER_SITE_OTHER)
Admission: RE | Admit: 2014-05-13 | Discharge: 2014-05-13 | Disposition: A | Discharge status: Home / Self Care | Payer: PRIVATE HEALTH INSURANCE | Source: Ambulatory Visit | Attending: Family Medicine | Admitting: Family Medicine

## 2014-05-13 VITALS — BP 106/64 | HR 75 | Temp 97.5°F | Wt 151.5 lb

## 2014-05-13 DIAGNOSIS — M25561 Pain in right knee: Secondary | ICD-10-CM

## 2014-05-13 DIAGNOSIS — M25569 Pain in unspecified knee: Secondary | ICD-10-CM
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
## 2014-05-13 NOTE — Progress Notes (Signed)
Pre visit review using our clinic review tool, if applicable. No additional management support is needed unless otherwise documented below in the visit note. 

## 2014-05-13 NOTE — Patient Instructions (Signed)
Good to see you. I will call your xray results.  [facility]
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
## 2014-05-22 ENCOUNTER — Encounter: Payer: Self-pay | Admitting: Family Medicine

## 2014-05-28 ENCOUNTER — Encounter: Payer: Self-pay | Admitting: Family Medicine

## 2014-05-29 ENCOUNTER — Telehealth: Payer: Self-pay | Admitting: Family Medicine

## 2014-05-29 ENCOUNTER — Encounter: Payer: Self-pay | Admitting: *Deleted

## 2014-05-29 ENCOUNTER — Other Ambulatory Visit: Payer: Self-pay | Admitting: Family Medicine

## 2014-05-29 MED ORDER — LORAZEPAM 0.5 MG PO TABS: 0.5000 mg | ORAL_TABLET | Freq: Three times a day (TID) | ORAL | Status: DC

## 2014-05-29 MED ORDER — OXYCODONE HCL ER 10 MG PO T12A: 10.0000 mg | EXTENDED_RELEASE_TABLET | Freq: Two times a day (BID) | ORAL | Status: DC

## 2014-05-29 NOTE — Telephone Encounter (Signed)
Spoke to pt and informed her Rx is available for pickup; advised substance contract will have to be renewed. Rx will be at my desk

## 2014-05-29 NOTE — Telephone Encounter (Signed)
Ok to refill one month supply of both as requested.

## 2014-05-29 NOTE — Telephone Encounter (Signed)
Pt called requesting Atavin and Oxycontin. Pt knows she needs to update her controlled substance contract and will do so when she picks up rx.

## 2014-05-30 MED ORDER — LORAZEPAM 0.5 MG PO TABS: 0.5000 mg | ORAL_TABLET | Freq: Three times a day (TID) | ORAL | Status: DC

## 2014-05-30 MED ORDER — OXYCODONE HCL ER 10 MG PO T12A: 10.0000 mg | EXTENDED_RELEASE_TABLET | Freq: Two times a day (BID) | ORAL | Status: DC

## 2014-05-30 NOTE — Telephone Encounter (Signed)
Pt came into office and completed UDS and updated controlled substance contract

## 2014-05-31 ENCOUNTER — Ambulatory Visit: Payer: PRIVATE HEALTH INSURANCE | Admitting: Family Medicine

## 2014-06-17 ENCOUNTER — Encounter: Payer: Self-pay | Admitting: Family Medicine

## 2014-07-02 ENCOUNTER — Other Ambulatory Visit: Payer: Self-pay | Admitting: Family Medicine

## 2014-07-02 MED ORDER — LORAZEPAM 0.5 MG PO TABS: 0.5000 mg | ORAL_TABLET | Freq: Three times a day (TID) | ORAL | Status: DC

## 2014-07-02 MED ORDER — OXYCODONE HCL ER 10 MG PO T12A: 10.0000 mg | EXTENDED_RELEASE_TABLET | Freq: Two times a day (BID) | ORAL | Status: DC

## 2014-07-02 NOTE — Telephone Encounter (Signed)
Pt requesting medication refill. Last f/u appt 04/2014 with no future appts scheduled. pls advise 

## 2014-07-02 NOTE — Telephone Encounter (Signed)
Spoke to pt and informed her Rx is available for pickup.  

## 2014-07-29 ENCOUNTER — Other Ambulatory Visit: Payer: Self-pay | Admitting: Family Medicine

## 2014-07-29 MED ORDER — LORAZEPAM 0.5 MG PO TABS: 0.5000 mg | ORAL_TABLET | Freq: Three times a day (TID) | ORAL | Status: DC

## 2014-07-29 MED ORDER — OXYCODONE HCL ER 10 MG PO T12A: 10.0000 mg | EXTENDED_RELEASE_TABLET | Freq: Two times a day (BID) | ORAL | Status: DC

## 2014-07-29 NOTE — Telephone Encounter (Signed)
Pt requesting medication refill. Last f/u appt 05/2014 with no future appts scheduled. pls advise  

## 2014-07-30 ENCOUNTER — Encounter: Payer: Self-pay | Admitting: Family Medicine

## 2014-08-06 ENCOUNTER — Telehealth: Payer: Self-pay | Admitting: Family Medicine

## 2014-08-06 NOTE — Telephone Encounter (Signed)
Attempted to call pt- no voicemail set up on phone number we have- UDS positive for cocaine.  We will no longer be able to refill her controlled substances. If we cannot reach her, please send her a letter stating this.

## 2014-08-19 ENCOUNTER — Encounter: Payer: Self-pay | Admitting: Family Medicine

## 2014-09-03 ENCOUNTER — Other Ambulatory Visit: Payer: Self-pay | Admitting: Family Medicine

## 2014-09-03 NOTE — Telephone Encounter (Signed)
I sent pt message back that no more controlled substances can be prescribed.

## 2014-10-11 ENCOUNTER — Other Ambulatory Visit: Payer: Self-pay

## 2023-07-06 DIAGNOSIS — F1010 Alcohol abuse, uncomplicated: Secondary | ICD-10-CM | POA: Diagnosis not present

## 2023-07-14 DIAGNOSIS — F1010 Alcohol abuse, uncomplicated: Secondary | ICD-10-CM | POA: Diagnosis not present

## 2023-07-19 DIAGNOSIS — F1010 Alcohol abuse, uncomplicated: Secondary | ICD-10-CM | POA: Diagnosis not present

## 2023-09-05 DIAGNOSIS — E663 Overweight: Secondary | ICD-10-CM | POA: Diagnosis not present

## 2023-09-05 DIAGNOSIS — Z713 Dietary counseling and surveillance: Secondary | ICD-10-CM | POA: Diagnosis not present

## 2023-09-15 DIAGNOSIS — Z124 Encounter for screening for malignant neoplasm of cervix: Secondary | ICD-10-CM | POA: Diagnosis not present

## 2023-09-15 DIAGNOSIS — Z1151 Encounter for screening for human papillomavirus (HPV): Secondary | ICD-10-CM | POA: Diagnosis not present

## 2023-09-15 DIAGNOSIS — Z01419 Encounter for gynecological examination (general) (routine) without abnormal findings: Secondary | ICD-10-CM | POA: Diagnosis not present

## 2023-10-05 DIAGNOSIS — R208 Other disturbances of skin sensation: Secondary | ICD-10-CM | POA: Diagnosis not present

## 2023-10-05 DIAGNOSIS — D225 Melanocytic nevi of trunk: Secondary | ICD-10-CM | POA: Diagnosis not present

## 2023-10-05 DIAGNOSIS — L858 Other specified epidermal thickening: Secondary | ICD-10-CM | POA: Diagnosis not present

## 2023-10-05 DIAGNOSIS — D2261 Melanocytic nevi of right upper limb, including shoulder: Secondary | ICD-10-CM | POA: Diagnosis not present

## 2023-10-05 DIAGNOSIS — D2262 Melanocytic nevi of left upper limb, including shoulder: Secondary | ICD-10-CM | POA: Diagnosis not present

## 2023-10-05 DIAGNOSIS — D485 Neoplasm of uncertain behavior of skin: Secondary | ICD-10-CM | POA: Diagnosis not present

## 2023-10-05 DIAGNOSIS — D2271 Melanocytic nevi of right lower limb, including hip: Secondary | ICD-10-CM | POA: Diagnosis not present

## 2023-11-01 DIAGNOSIS — N951 Menopausal and female climacteric states: Secondary | ICD-10-CM | POA: Diagnosis not present

## 2023-11-01 DIAGNOSIS — Z79890 Hormone replacement therapy (postmenopausal): Secondary | ICD-10-CM | POA: Diagnosis not present

## 2024-01-24 DIAGNOSIS — N951 Menopausal and female climacteric states: Secondary | ICD-10-CM | POA: Diagnosis not present

## 2024-01-24 DIAGNOSIS — G4700 Insomnia, unspecified: Secondary | ICD-10-CM | POA: Diagnosis not present

## 2024-01-24 DIAGNOSIS — Z79890 Hormone replacement therapy (postmenopausal): Secondary | ICD-10-CM | POA: Diagnosis not present

## 2024-09-11 ENCOUNTER — Other Ambulatory Visit: Payer: Self-pay | Admitting: Medical Genetics

## 2024-09-12 ENCOUNTER — Other Ambulatory Visit: Payer: PRIVATE HEALTH INSURANCE

## 2024-09-18 DIAGNOSIS — Z01419 Encounter for gynecological examination (general) (routine) without abnormal findings: Secondary | ICD-10-CM | POA: Diagnosis not present

## 2024-10-03 ENCOUNTER — Other Ambulatory Visit: Payer: Self-pay | Admitting: Gastroenterology

## 2024-10-03 DIAGNOSIS — Z136 Encounter for screening for cardiovascular disorders: Secondary | ICD-10-CM

## 2024-10-03 NOTE — Progress Notes (Signed)
 Order placed for CT cardiac scoring for [place] screening protocol.

## 2024-11-15 ENCOUNTER — Ambulatory Visit: Payer: Self-pay | Admitting: Gastroenterology

## 2024-11-15 VITALS — BP 119/70 | Temp 97.6°F | Resp 16 | Ht 63.0 in | Wt 145.0 lb

## 2024-11-15 DIAGNOSIS — J069 Acute upper respiratory infection, unspecified: Secondary | ICD-10-CM

## 2024-11-15 NOTE — Patient Instructions (Signed)
 Start taking Zyrtec-D every 12 hours to help improve sinus congestion.  This can be purchased behind the counter at the pharmacy.  You can also start using Flonase  (fluticasone ).  Follow instructions on label.  This can be purchased over-the-counter.  Ensure that you are staying well-hydrated.  Drinking lots of water will help minimize throat irritation from posterior nasal drainage.  Ensure that you are getting adequate sleep nightly.  You can try sleeping elevated and/or with a humidifier.  We will follow- up as needed for persistent or worsening symptoms.   It was nice to meet you today!   Josette Centers, PA-C

## 2024-11-15 NOTE — Progress Notes (Signed)
 Chief Complaint  Patient presents with   Nasal Congestion    Nasal Congestion x 2 days ; Denies any fever, sore throat, body aches.     HPI:   Sabrina Robertson is a 43 y.o. female presenting today for evaluation of nasal congestion. Reports she has been sick for 8 days. Symptoms include nasal congestion, drainage in back of throat, mild cough/clearing her throat when laying down at night only, and headache. Drainage from nose is clear. Last week had fever, body aches x 2 days. Took at home COVID test last week which was negative.   Denies sore throat, face pain, or ear pain. Feels like there is some liquid in her ear. Denies shortness of breath, chest pain, palpitations.   Has been taking Zycam, and vitamin for immunity.    Past Medical History:  Diagnosis Date   Allergy     Past Surgical History:  Procedure Laterality Date   ORIF ACETABULAR FRACTURE  1999   Left 4th finger   WISDOM TOOTH EXTRACTION      Current Outpatient Medications  Medication Sig Dispense Refill   Multiple Vitamin (MULTIVITAMIN WITH MINERALS) TABS tablet Take 1 tablet by mouth daily.     No current facility-administered medications for this visit.    Allergies as of 11/15/2024 - Review Complete 11/15/2024  Allergen Reaction Noted   Amoxicillin      Family History  Problem Relation Age of Onset   Hypertension Father    Hypertension Maternal Aunt    Diabetes Maternal Grandmother     Social History   Socioeconomic History   Marital status: Single    Spouse name: Not on file   Number of children: Not on file   Years of education: Not on file   Highest education level: Not on file  Occupational History   Occupation: Accounting  Tobacco Use   Smoking status: Never   Smokeless tobacco: Never  Substance and Sexual Activity   Alcohol use: No    Comment: occ-weekend   Drug use: No   Sexual activity: Not on file  Other Topics Concern   Not on file  Social History Narrative   Not on  file   Social Drivers of Health   Financial Resource Strain: Not on file  Food Insecurity: Not on file  Transportation Needs: Not on file  Physical Activity: Not on file  Stress: Not on file  Social Connections: Not on file  Intimate Partner Violence: Not on file    Review of Systems: Gen: Denies any fever, chills, lightheadedness, dizziness, presyncope, syncope. CV: Denies chest pain, heart palpitations. Resp: Denies shortness of breath.  Admits to mild cough at night. GI: Denies nausea or vomiting. MS: Denies joint pain.  Physical Exam: BP 119/70 (BP Location: Left Arm, Patient Position: Sitting, Cuff Size: Normal)   Temp 97.6 F (36.4 C) (Oral)   Resp 16   Ht 5' 3 (1.6 m)   Wt 145 lb (65.8 kg)   SpO2 98%   BMI 25.69 kg/m  General:   Alert and oriented. Pleasant and cooperative. Well-nourished and well-developed.  Head:  Normocephalic and atraumatic. Eyes:  Without icterus, sclera clear and conjunctiva pink.  Ears:  Normal auditory acuity.  TMs intact bilaterally with no evidence of erythema or middle ear effusion.  Ear canals appear normal without erythema.  Small amount of wax present. Lungs:  Clear to auscultation bilaterally. No wheezes, rales, or rhonchi. No distress.  Heart:  S1, S2 present  without murmurs appreciated.  Skin:  Intact without significant lesions or rashes. Psych:  Alert and cooperative. Normal mood and affect.    Assessment:  43 year old female presents today for evaluation of nasal congestion with associated PND and headache.  Symptoms most consistent with viral URI x8 days. Unable to rule out COVID or flu as patient does report having fever and bodyaches for 2 days last week, but these symptoms have resolved.  She has no alarm symptoms.  Symptoms are not consistent with bacterial rhinosinusitis.  Recommended supportive measures at least for the next week.  If persistent or worsening symptoms, advised she can return to our clinic for further  evaluation.   Plan:  Start Zyrtec-D twice daily. Start Flonase .  Follow instructions on label. Continue ibuprofen or Tylenol  as needed for headache. Encouraged adequate hydration and sleep. Advised to sleep elevated. Encouraged use of humidifier at night. Follow-up as needed.  Advised to return to our clinic if persistent or worsening symptoms.   Josette Centers, PA-C [facility] 11/15/2024

## 2024-12-10 DIAGNOSIS — E663 Overweight: Secondary | ICD-10-CM | POA: Diagnosis not present

## 2024-12-10 DIAGNOSIS — Z131 Encounter for screening for diabetes mellitus: Secondary | ICD-10-CM | POA: Diagnosis not present

## 2024-12-10 DIAGNOSIS — R635 Abnormal weight gain: Secondary | ICD-10-CM | POA: Diagnosis not present

## 2024-12-10 DIAGNOSIS — Z13220 Encounter for screening for lipoid disorders: Secondary | ICD-10-CM | POA: Diagnosis not present

## 2024-12-10 DIAGNOSIS — R5382 Chronic fatigue, unspecified: Secondary | ICD-10-CM | POA: Diagnosis not present

## 2024-12-10 DIAGNOSIS — E8889 Other specified metabolic disorders: Secondary | ICD-10-CM | POA: Diagnosis not present

## 2024-12-10 DIAGNOSIS — Z1331 Encounter for screening for depression: Secondary | ICD-10-CM | POA: Diagnosis not present

## 2024-12-10 DIAGNOSIS — Z6827 Body mass index (BMI) 27.0-27.9, adult: Secondary | ICD-10-CM | POA: Diagnosis not present

## 2025-01-09 ENCOUNTER — Emergency Department
Admission: EM | Admit: 2025-01-09 | Discharge: 2025-01-10 | Disposition: A | Discharge status: Home / Self Care | Payer: PRIVATE HEALTH INSURANCE | Attending: Emergency Medicine | Admitting: Emergency Medicine

## 2025-01-09 ENCOUNTER — Emergency Department: Payer: PRIVATE HEALTH INSURANCE

## 2025-01-09 ENCOUNTER — Encounter: Payer: Self-pay | Admitting: Emergency Medicine

## 2025-01-09 ENCOUNTER — Other Ambulatory Visit: Payer: Self-pay

## 2025-01-09 DIAGNOSIS — N939 Abnormal uterine and vaginal bleeding, unspecified: Secondary | ICD-10-CM | POA: Diagnosis present

## 2025-01-09 DIAGNOSIS — O039 Complete or unspecified spontaneous abortion without complication: Secondary | ICD-10-CM | POA: Diagnosis not present

## 2025-01-09 LAB — CBC
HCT: 38 % (ref 36.0–46.0)
Hemoglobin: 13.2 g/dL (ref 12.0–15.0)
MCH: 31.2 pg (ref 26.0–34.0)
MCHC: 34.7 g/dL (ref 30.0–36.0)
MCV: 89.8 fL (ref 80.0–100.0)
Platelets: 232 K/uL (ref 150–400)
RBC: 4.23 MIL/uL (ref 3.87–5.11)
RDW: 11.4 % — ABNORMAL LOW (ref 11.5–15.5)
WBC: 11.8 K/uL — ABNORMAL HIGH (ref 4.0–10.5)
nRBC: 0 % (ref 0.0–0.2)

## 2025-01-09 LAB — COMPREHENSIVE METABOLIC PANEL WITH GFR
ALT: 24 U/L (ref 0–44)
AST: 22 U/L (ref 15–41)
Albumin: 4.6 g/dL (ref 3.5–5.0)
Alkaline Phosphatase: 39 U/L (ref 38–126)
Anion gap: 18 — ABNORMAL HIGH (ref 5–15)
BUN: 13 mg/dL (ref 6–20)
CO2: 18 mmol/L — ABNORMAL LOW (ref 22–32)
Calcium: 9.9 mg/dL (ref 8.9–10.3)
Chloride: 99 mmol/L (ref 98–111)
Creatinine, Ser: 0.72 mg/dL (ref 0.44–1.00)
GFR, Estimated: >60 mL/min
Glucose, Bld: 137 mg/dL — ABNORMAL HIGH (ref 70–99)
Potassium: 3.3 mmol/L — ABNORMAL LOW (ref 3.5–5.1)
Sodium: 135 mmol/L (ref 135–145)
Total Bilirubin: 0.5 mg/dL (ref 0.0–1.2)
Total Protein: 7.4 g/dL (ref 6.5–8.1)

## 2025-01-09 LAB — LIPASE, BLOOD: Lipase: 37 U/L (ref 11–51)

## 2025-01-09 LAB — HCG, QUANTITATIVE, PREGNANCY: hCG, Beta Chain, Quant, S: 2770 m[IU]/mL — ABNORMAL HIGH

## 2025-01-09 MED ORDER — OXYCODONE-ACETAMINOPHEN 5-325 MG PO TABS
1.0000 | ORAL_TABLET | Freq: Once | ORAL | Status: AC
  Administered 2025-01-09: 1 via ORAL
  Filled 2025-01-09: qty 1

## 2025-01-09 MED ORDER — MORPHINE SULFATE (PF) 2 MG/ML IV SOLN
2.0000 mg | Freq: Once | INTRAVENOUS | Status: AC
  Administered 2025-01-09: 2 mg via INTRAMUSCULAR
  Filled 2025-01-09: qty 1

## 2025-01-09 MED ORDER — HYDROCODONE-ACETAMINOPHEN 5-325 MG PO TABS: 1.0000 | ORAL_TABLET | Freq: Three times a day (TID) | ORAL | 0 refills | Status: AC | PRN

## 2025-01-09 MED ORDER — ONDANSETRON 4 MG PO TBDP: 4.0000 mg | ORAL_TABLET | Freq: Three times a day (TID) | ORAL | 0 refills | Status: AC | PRN

## 2025-01-09 MED ORDER — ONDANSETRON 4 MG PO TBDP
4.0000 mg | ORAL_TABLET | Freq: Once | ORAL | Status: AC
  Administered 2025-01-09: 4 mg via ORAL
  Filled 2025-01-09: qty 1

## 2025-01-09 MED ORDER — IBUPROFEN 800 MG PO TABS: 800.0000 mg | ORAL_TABLET | Freq: Three times a day (TID) | ORAL | 0 refills | Status: AC | PRN

## 2025-01-09 NOTE — ED Notes (Signed)
 Pt taken to [facility] for urine specimen but unable to obtain

## 2025-01-09 NOTE — ED Provider Triage Note (Signed)
 Emergency Medicine Provider Triage Evaluation Note  Sabrina Robertson , a 44 y.o. female  was evaluated in triage.  Pt complains of lower abdominal pain.  Recent spontaneous miscarriage 1 month ago.  Severe cramping and bleeding.  Some diarrhea and vomiting.  No fever.  Did not have a recheck after the miscarriage.  Review of Systems  Positive:  Negative:   Physical Exam  BP 128/75   Pulse 91   Resp (!) 22   Ht 5' 2 (1.575 m)   Wt 63.5 kg   LMP 01/01/2025 (Approximate)   SpO2 100%   BMI 25.61 kg/m  Gen:   Awake, no distress   Resp:  Normal effort  MSK:   Moves extremities without difficulty  Other:    Medical Decision Making  Medically screening exam initiated at 7:02 PM.  Appropriate orders placed.  Brody Kump Otani was informed that the remainder of the evaluation will be completed by another provider, this initial triage assessment does not replace that evaluation, and the importance of remaining in the ED until their evaluation is complete.     Gasper Devere ORN, PA-C 01/09/25 1902

## 2025-01-09 NOTE — ED Triage Notes (Signed)
 Pt reports lower abd pain since 4 pm. Endorses 1 episode of emesis and some diarrhea. Also reports vaginal bleeding, first period since miscarriage a month ago.

## 2025-01-09 NOTE — ED Provider Notes (Signed)
 "   [facility] Emergency Department Provider Note     Event Date/Time   First MD Initiated Contact with Patient 01/09/25 2028     (approximate)   History   Abdominal Pain   HPI  Sabrina Robertson is a 44 y.o. female G1P0 presents to the ED endorsing lower abdominal pain since 4 PM.  She is also reporting some spontaneous vaginal bleeding with onset last week.  Patient had a confirmed blood pregnancy test, last month, via her PCP.  Later last month, she experienced some spontaneous vaginal bleeding, thought she was miscarrying at that time.  She did not follow back up with her primary provider at the spontaneous bleeding.  It was not until last week, 01/01/25, when she began to develop some significant cramping as well as recurrence of this heavy vaginal bleeding and clot passage.  She continues to endorse heavy vaginal bleeding and pelvic pain.   Physical Exam   Triage Vital Signs: ED Triage Vitals  Encounter Vitals Group     BP 01/09/25 1900 128/75     Girls Systolic BP Percentile --      Girls Diastolic BP Percentile --      Boys Systolic BP Percentile --      Boys Diastolic BP Percentile --      Pulse Rate 01/09/25 1858 91     Resp 01/09/25 1900 (!) 22     Temp --      Temp src --      SpO2 01/09/25 1858 100 %     Weight 01/09/25 1857 140 lb (63.5 kg)     Height 01/09/25 1857 5' 2 (1.575 m)     Head Circumference --      Peak Flow --      Pain Score 01/09/25 1857 10     Pain Loc --      Pain Education --      Exclude from Growth Chart --     Most recent vital signs: Vitals:   01/09/25 1858 01/09/25 1900  BP:  128/75  Pulse: 91   Resp:  (!) 22  SpO2: 100%     General Awake, no distress. NAD HEENT NCAT. PERRL. EOMI. No rhinorrhea. Mucous membranes are moist.  CV:  Good peripheral perfusion.  RESP:  Normal effort.  ABD:  No distention.  GYN:  deferred   ED Results / Procedures / Treatments   Labs (all labs ordered are listed, but  only abnormal results are displayed) Labs Reviewed  COMPREHENSIVE METABOLIC PANEL WITH GFR - Abnormal; Notable for the following components:      Result Value   Potassium 3.3 (*)    CO2 18 (*)    Glucose, Bld 137 (*)    Anion gap 18 (*)    All other components within normal limits  CBC - Abnormal; Notable for the following components:   WBC 11.8 (*)    RDW 11.4 (*)    All other components within normal limits  HCG, QUANTITATIVE, PREGNANCY - Abnormal; Notable for the following components:   hCG, Beta Chain, Quant, S 2,770 (*)    All other components within normal limits  LIPASE, BLOOD  URINALYSIS, ROUTINE W REFLEX MICROSCOPIC  POC URINE PREG, ED  ABO/RH     EKG   RADIOLOGY  I personally viewed and evaluated these images as part of my medical decision making, as well as reviewing the written report by the radiologist.  ED Provider Interpretation: IUP back  in the lower uterine segment, concerning for a concurrent pregnancy loss  US  OB LESS THAN 14 WEEKS WITH OB TRANSVAGINAL Result Date: 01/09/2025 CLINICAL DATA:  Vaginal bleeding EXAM: OBSTETRIC <14 WK US  AND TRANSVAGINAL OB US  TECHNIQUE: Both transabdominal and transvaginal ultrasound examinations were performed for complete evaluation of the gestation as well as the maternal uterus, adnexal regions, and pelvic cul-de-sac. Transvaginal technique was performed to assess early pregnancy. COMPARISON:  None Available. FINDINGS: Intrauterine gestational sac: Single intrauterine gestational sac, visualized within the lower uterine segment/cervix. Yolk sac:  Possibly visualized Embryo:  Not visualized MSD: 33.6 mm   8 w   4 d Subchorionic hemorrhage:  Moderate subchorionic hemorrhage Maternal uterus/adnexae: Right ovary measures 3.2 x 1.5 by 2 cm and the left ovary measures 2.1 x 1.6 by 1.7 cm. No free fluid IMPRESSION: 1. Single intrauterine gestational sac and possible yolk sac but abnormal in position and visualized within the lower  uterine segment and cervix, concerning for pregnancy loss in progress. 2. Moderate subchorionic hemorrhage Electronically Signed   By: Luke Bun M.D.   On: 01/09/2025 22:07    PROCEDURES:  Critical Care performed: No  Procedures   MEDICATIONS ORDERED IN ED: Medications  morphine  (PF) 2 MG/ML injection 2 mg (2 mg Intramuscular Given 01/09/25 2007)  ondansetron  (ZOFRAN -ODT) disintegrating tablet 4 mg (4 mg Oral Given 01/09/25 2359)  oxyCODONE -acetaminophen  (PERCOCET/ROXICET) 5-325 MG per tablet 1 tablet (1 tablet Oral Given 01/09/25 2359)     IMPRESSION / MDM / ASSESSMENT AND PLAN / ED COURSE  I reviewed the triage vital signs and the nursing notes.                              Differential diagnosis includes, but is not limited to, threatened miscarriage, incomplete miscarriage, normal bleeding from an early trimester pregnancy, ectopic pregnancy, , blighted ovum, vaginal/cervical trauma, subchorionic hemorrhage/hematoma, etc.  Patient's presentation is most consistent with acute complicated illness / injury requiring diagnostic workup.  Patient's diagnosis is consistent with spontaneous miscarriage in early first trimester.  Patient presents with 1 week of pelvic cramping and moderate vaginal bleeding, with a previously confirmed pregnancy.  Patient was approximately [redacted] weeks gestation, which she experienced with spontaneous nonpainful vaginal bleeding.  She is under that time, that she had a full and complete miscarriage.  It was not until 1 week ago, she began to experience some heavy vaginal bleeding, current pain, and passage of large clots.  She presents to the ED today, for evaluation, found to have a beta quant of 2700.  Pelvic ultrasound confirms a IUP measuring approximately 8 weeks, without confirmed yolk sac, found in the lower uterine segment concerning for spontaneous or in progress miscarriage.  Patient otherwise stable at this time without leukocytosis or critical anemia.   Patient will be discharged home with prescriptions for hydrocodone , Zofran , and ibuprofen . Patient is to follow up with her OB provider for serial beta quant's, and follow-up pelvic ultrasound to confirm complete abortion. Patient is given ED precautions to return to the ED for any worsening or new symptoms.   FINAL CLINICAL IMPRESSION(S) / ED DIAGNOSES   Final diagnoses:  Spontaneous miscarriage     Rx / DC Orders   ED Discharge Orders          Ordered    HYDROcodone -acetaminophen  (NORCO/VICODIN) 5-325 MG tablet  3 times daily PRN        01/09/25 2333    ondansetron  (  ZOFRAN -ODT) 4 MG disintegrating tablet  Every 8 hours PRN        01/09/25 2333    ibuprofen  (ADVIL ) 800 MG tablet  Every 8 hours PRN        01/09/25 2333             Note:  This document was prepared using Dragon voice recognition software and may include unintentional dictation errors.    Loyd Candida LULLA Aldona, PA-C 01/10/25 0004    Cyrena Mylar, MD 01/10/25 [phone]  "

## 2025-01-09 NOTE — Discharge Instructions (Addendum)
 Your labs and ultrasound appear to confirm a spontaneous miscarriage.  Your beta quant was elevated to 2700, however the ultrasound, shows what appears to be an early miscarriage.  You would likely experience ongoing bleeding and cramping.  You would likely also experience passage of large clots and tissue.  You should take the pain medicine as needed and the nausea medicine as prescribed.  You should follow-up with your OB provider for routine follow-up which includes repeat lab and ultrasound to confirm complete resolution.  Return to the ED for acute fevers, abdominal pain, or weakness.

## 2025-01-09 NOTE — ED Notes (Signed)
 Patient transported to Ultrasound

## 2025-01-10 LAB — ABO/RH: ABO/RH(D): A POS
